# Patient Record
Sex: Female | Born: 1985
Health system: Southern US, Community
[De-identification: ages and names within clinical notes are randomized; demographics above are authoritative.]

## PROBLEM LIST (undated history)

## (undated) DIAGNOSIS — E282 Polycystic ovarian syndrome: Secondary | ICD-10-CM

## (undated) DIAGNOSIS — R87629 Unspecified abnormal cytological findings in specimens from vagina: Secondary | ICD-10-CM

## (undated) DIAGNOSIS — Q141 Congenital malformation of retina: Secondary | ICD-10-CM

## (undated) DIAGNOSIS — F32A Depression, unspecified: Secondary | ICD-10-CM

## (undated) DIAGNOSIS — Z800 Family history of malignant neoplasm of digestive organs: Secondary | ICD-10-CM

## (undated) DIAGNOSIS — Z803 Family history of malignant neoplasm of breast: Secondary | ICD-10-CM

## (undated) DIAGNOSIS — F329 Major depressive disorder, single episode, unspecified: Secondary | ICD-10-CM

## (undated) HISTORY — DX: Unspecified abnormal cytological findings in specimens from vagina: R87.629

## (undated) HISTORY — PX: ANKLE FRACTURE SURGERY: SHX122

## (undated) HISTORY — DX: Congenital malformation of retina: Q14.1

## (undated) HISTORY — DX: Depression, unspecified: F32.A

## (undated) HISTORY — DX: Family history of malignant neoplasm of breast: Z80.3

## (undated) HISTORY — PX: WISDOM TOOTH EXTRACTION: SHX21

## (undated) HISTORY — DX: Family history of malignant neoplasm of digestive organs: Z80.0

---
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
## 2004-04-06 ENCOUNTER — Emergency Department (HOSPITAL_COMMUNITY): Admission: EM | Admit: 2004-04-06 | Discharge: 2004-04-06 | Payer: Self-pay | Admitting: Emergency Medicine

## 2007-01-16 ENCOUNTER — Other Ambulatory Visit: Admission: RE | Admit: 2007-01-16 | Discharge: 2007-01-16 | Payer: Self-pay | Admitting: Obstetrics and Gynecology

## 2007-06-16 ENCOUNTER — Other Ambulatory Visit: Admission: RE | Admit: 2007-06-16 | Discharge: 2007-06-16 | Payer: Self-pay | Admitting: Obstetrics and Gynecology

## 2007-12-08 ENCOUNTER — Other Ambulatory Visit: Admission: RE | Admit: 2007-12-08 | Discharge: 2007-12-08 | Payer: Self-pay | Admitting: Obstetrics and Gynecology

## 2008-06-09 ENCOUNTER — Other Ambulatory Visit: Admission: RE | Admit: 2008-06-09 | Discharge: 2008-06-09 | Payer: Self-pay | Admitting: Obstetrics and Gynecology

## 2008-09-22 ENCOUNTER — Emergency Department (HOSPITAL_COMMUNITY): Admission: EM | Admit: 2008-09-22 | Discharge: 2008-09-22 | Payer: Self-pay | Admitting: Family Medicine

## 2008-12-13 ENCOUNTER — Other Ambulatory Visit: Admission: RE | Admit: 2008-12-13 | Discharge: 2008-12-13 | Payer: Self-pay | Admitting: Obstetrics and Gynecology

## 2009-03-20 ENCOUNTER — Emergency Department (HOSPITAL_COMMUNITY): Admission: EM | Admit: 2009-03-20 | Discharge: 2009-03-20 | Payer: Self-pay | Admitting: Family Medicine

## 2009-06-19 ENCOUNTER — Other Ambulatory Visit: Admission: RE | Admit: 2009-06-19 | Discharge: 2009-06-19 | Payer: Self-pay | Admitting: Obstetrics and Gynecology

## 2009-11-06 ENCOUNTER — Emergency Department (HOSPITAL_COMMUNITY)
Admission: EM | Admit: 2009-11-06 | Discharge: 2009-11-06 | Payer: Self-pay | Source: Home / Self Care | Admitting: Emergency Medicine

## 2010-11-12 ENCOUNTER — Emergency Department (INDEPENDENT_AMBULATORY_CARE_PROVIDER_SITE_OTHER)
Admission: EM | Admit: 2010-11-12 | Discharge: 2010-11-12 | Disposition: A | Discharge status: Home / Self Care | Payer: BC Managed Care – PPO | Source: Home / Self Care | Attending: Emergency Medicine | Admitting: Emergency Medicine

## 2010-11-12 DIAGNOSIS — R059 Cough, unspecified: Secondary | ICD-10-CM

## 2010-11-12 DIAGNOSIS — J069 Acute upper respiratory infection, unspecified: Secondary | ICD-10-CM

## 2010-11-12 DIAGNOSIS — R05 Cough: Secondary | ICD-10-CM

## 2010-11-12 MED ORDER — ACETAMINOPHEN-CODEINE #3 300-30 MG PO TABS: 1.0000 | ORAL_TABLET | Freq: Three times a day (TID) | ORAL | Status: AC

## 2010-11-12 MED ORDER — PREDNISONE 10 MG PO TABS: 20.0000 mg | ORAL_TABLET | Freq: Every day | ORAL | Status: AC

## 2010-11-12 NOTE — ED Notes (Signed)
C/o nasal congestion, sneezing , bad cough for  1 week; most of her secretions are clear, but has some green nasal secretions

## 2010-11-12 NOTE — ED Provider Notes (Signed)
History     CSN: [id number] Arrival date & time: 11/12/2010  3:59 PM   First MD Initiated Contact with Patient 11/12/10 1713      Chief Complaint  Patient presents with  . Nasal Congestion    (Consider location/radiation/quality/duration/timing/severity/associated sxs/prior treatment) Patient is a 25 y.o. female presenting with cough. The history is provided by the patient.  Cough The current episode started more than 1 week ago. The problem has been gradually worsening. The cough is productive of sputum. There has been no fever. Associated symptoms include ear congestion, ear pain, rhinorrhea and shortness of breath. Pertinent negatives include no chest pain, no sweats and no wheezing. She has tried nothing for the symptoms. Risk factors include chemical exposure. She is not a smoker. Her past medical history does not include pneumonia, emphysema or asthma.    History reviewed. No pertinent past medical history.  Past Surgical History  Procedure Date  . Ankle fracture surgery     History reviewed. No pertinent family history.  History  Substance Use Topics  . Smoking status: Never Smoker   . Smokeless tobacco: Not on file  . Alcohol Use:      occasional    OB History    Grav Para Term Preterm Abortions TAB SAB Ect Mult Living                  Review of Systems  Constitutional: Positive for fatigue. Negative for activity change.  HENT: Positive for ear pain, rhinorrhea, postnasal drip and ear discharge.   Respiratory: Positive for cough and shortness of breath. Negative for apnea and wheezing.   Cardiovascular: Negative for chest pain, palpitations and leg swelling.    Allergies  Review of patient's allergies indicates no known allergies.  Home Medications   Current Outpatient Rx  Name Route Sig Dispense Refill  . ACETAMINOPHEN-CODEINE #3 300-30 MG PO TABS Oral Take 1-2 tablets by mouth 3 (three) times daily. 15 tablet 0  . PREDNISONE 10 MG PO TABS Oral Take 2  tablets (20 mg total) by mouth daily. 15 tablet 0    BP 141/92  Pulse 99  Temp(Src) 99.2 F (37.3 C) (Oral)  Resp 20  SpO2 98%  LMP 10/24/2010  Physical Exam  Constitutional: She appears well-developed. No distress.  HENT:  Right Ear: External ear normal.  Left Ear: External ear normal.  Mouth/Throat: No oropharyngeal exudate.  Eyes: Pupils are equal, round, and reactive to light.  Cardiovascular: Normal rate.   Pulmonary/Chest: Effort normal and breath sounds normal. No respiratory distress. She has no wheezes.  Lymphadenopathy:    She has no cervical adenopathy.  Skin: No rash noted. She is not diaphoretic.  Psychiatric: She has a normal mood and affect.    ED Course  Procedures (including critical care time)  Labs Reviewed - No data to display No results found.   1. Cough   2. Upper respiratory infection       MDM  Coughing and congestion        Jimmie Molly 11/12/10 1738

## 2011-08-13 ENCOUNTER — Other Ambulatory Visit: Payer: Self-pay | Admitting: Obstetrics and Gynecology

## 2011-08-13 ENCOUNTER — Other Ambulatory Visit (HOSPITAL_COMMUNITY)
Admission: RE | Admit: 2011-08-13 | Discharge: 2011-08-13 | Disposition: A | Discharge status: Home / Self Care | Payer: BC Managed Care – PPO | Source: Ambulatory Visit | Attending: Obstetrics and Gynecology | Admitting: Obstetrics and Gynecology

## 2011-08-13 DIAGNOSIS — Z113 Encounter for screening for infections with a predominantly sexual mode of transmission: Secondary | ICD-10-CM | POA: Insufficient documentation

## 2011-08-13 DIAGNOSIS — N760 Acute vaginitis: Secondary | ICD-10-CM | POA: Insufficient documentation

## 2011-08-13 DIAGNOSIS — Z01419 Encounter for gynecological examination (general) (routine) without abnormal findings: Secondary | ICD-10-CM | POA: Insufficient documentation

## 2012-10-30 ENCOUNTER — Other Ambulatory Visit: Payer: Self-pay | Admitting: Obstetrics and Gynecology

## 2012-10-30 ENCOUNTER — Other Ambulatory Visit (HOSPITAL_COMMUNITY)
Admission: RE | Admit: 2012-10-30 | Discharge: 2012-10-30 | Disposition: A | Discharge status: Home / Self Care | Payer: BC Managed Care – PPO | Source: Ambulatory Visit | Attending: Obstetrics and Gynecology | Admitting: Obstetrics and Gynecology

## 2012-10-30 DIAGNOSIS — Z01419 Encounter for gynecological examination (general) (routine) without abnormal findings: Secondary | ICD-10-CM | POA: Insufficient documentation

## 2013-11-08 ENCOUNTER — Other Ambulatory Visit: Payer: Self-pay | Admitting: Obstetrics and Gynecology

## 2013-11-08 ENCOUNTER — Other Ambulatory Visit (HOSPITAL_COMMUNITY)
Admission: RE | Admit: 2013-11-08 | Discharge: 2013-11-08 | Disposition: A | Discharge status: Home / Self Care | Payer: BC Managed Care – PPO | Source: Ambulatory Visit | Attending: Obstetrics and Gynecology | Admitting: Obstetrics and Gynecology

## 2013-11-08 DIAGNOSIS — Z1151 Encounter for screening for human papillomavirus (HPV): Secondary | ICD-10-CM | POA: Insufficient documentation

## 2013-11-08 DIAGNOSIS — Z01419 Encounter for gynecological examination (general) (routine) without abnormal findings: Secondary | ICD-10-CM | POA: Diagnosis not present

## 2013-11-11 LAB — CYTOLOGY - PAP

## 2014-12-06 ENCOUNTER — Other Ambulatory Visit: Payer: Self-pay | Admitting: Obstetrics and Gynecology

## 2014-12-06 ENCOUNTER — Other Ambulatory Visit (HOSPITAL_COMMUNITY)
Admission: RE | Admit: 2014-12-06 | Discharge: 2014-12-06 | Disposition: A | Discharge status: Home / Self Care | Payer: BLUE CROSS/BLUE SHIELD | Source: Ambulatory Visit | Attending: Obstetrics and Gynecology | Admitting: Obstetrics and Gynecology

## 2014-12-06 DIAGNOSIS — Z01419 Encounter for gynecological examination (general) (routine) without abnormal findings: Secondary | ICD-10-CM | POA: Diagnosis present

## 2014-12-08 LAB — CYTOLOGY - PAP

## 2015-10-12 ENCOUNTER — Emergency Department (HOSPITAL_COMMUNITY): Payer: BLUE CROSS/BLUE SHIELD

## 2015-10-12 ENCOUNTER — Emergency Department (HOSPITAL_COMMUNITY)
Admission: EM | Admit: 2015-10-12 | Discharge: 2015-10-12 | Disposition: A | Discharge status: Home / Self Care | Payer: BLUE CROSS/BLUE SHIELD | Attending: Emergency Medicine | Admitting: Emergency Medicine

## 2015-10-12 ENCOUNTER — Encounter (HOSPITAL_COMMUNITY): Payer: Self-pay | Admitting: *Deleted

## 2015-10-12 DIAGNOSIS — R1013 Epigastric pain: Secondary | ICD-10-CM | POA: Insufficient documentation

## 2015-10-12 DIAGNOSIS — R12 Heartburn: Secondary | ICD-10-CM

## 2015-10-12 DIAGNOSIS — K8020 Calculus of gallbladder without cholecystitis without obstruction: Secondary | ICD-10-CM | POA: Insufficient documentation

## 2015-10-12 LAB — URINALYSIS, ROUTINE W REFLEX MICROSCOPIC
Bilirubin Urine: NEGATIVE
Glucose, UA: NEGATIVE mg/dL
HGB URINE DIPSTICK: NEGATIVE
Ketones, ur: NEGATIVE mg/dL
Nitrite: POSITIVE — AB
PROTEIN: NEGATIVE mg/dL
Specific Gravity, Urine: 1.028 (ref 1.005–1.030)
pH: 6 (ref 5.0–8.0)

## 2015-10-12 LAB — COMPREHENSIVE METABOLIC PANEL
ALT: 58 U/L — ABNORMAL HIGH (ref 14–54)
ANION GAP: 10 (ref 5–15)
AST: 111 U/L — ABNORMAL HIGH (ref 15–41)
Albumin: 3.7 g/dL (ref 3.5–5.0)
Alkaline Phosphatase: 89 U/L (ref 38–126)
BILIRUBIN TOTAL: 0.4 mg/dL (ref 0.3–1.2)
BUN: 14 mg/dL (ref 6–20)
CALCIUM: 9.5 mg/dL (ref 8.9–10.3)
CO2: 25 mmol/L (ref 22–32)
CREATININE: 0.87 mg/dL (ref 0.44–1.00)
Chloride: 104 mmol/L (ref 101–111)
Glucose, Bld: 113 mg/dL — ABNORMAL HIGH (ref 65–99)
POTASSIUM: 3.7 mmol/L (ref 3.5–5.1)
Sodium: 139 mmol/L (ref 135–145)
Total Protein: 7.2 g/dL (ref 6.5–8.1)

## 2015-10-12 LAB — CBC
HCT: 39.7 % (ref 36.0–46.0)
Hemoglobin: 12.3 g/dL (ref 12.0–15.0)
MCH: 22.1 pg — AB (ref 26.0–34.0)
MCHC: 31 g/dL (ref 30.0–36.0)
MCV: 71.3 fL — ABNORMAL LOW (ref 78.0–100.0)
PLATELETS: 261 10*3/uL (ref 150–400)
RBC: 5.57 MIL/uL — AB (ref 3.87–5.11)
RDW: 14.9 % (ref 11.5–15.5)
WBC: 11.3 10*3/uL — AB (ref 4.0–10.5)

## 2015-10-12 LAB — URINE MICROSCOPIC-ADD ON: RBC / HPF: NONE SEEN RBC/hpf (ref 0–5)

## 2015-10-12 LAB — LIPASE, BLOOD: LIPASE: 32 U/L (ref 11–51)

## 2015-10-12 MED ORDER — GI COCKTAIL ~~LOC~~
30.0000 mL | Freq: Once | ORAL | Status: AC
Start: 2015-10-12 — End: 2015-10-12
  Administered 2015-10-12: 30 mL via ORAL
  Filled 2015-10-12: qty 30

## 2015-10-12 MED ORDER — HYDROCODONE-ACETAMINOPHEN 5-325 MG PO TABS: 1.0000 | ORAL_TABLET | ORAL | 0 refills | Status: DC | PRN

## 2015-10-12 NOTE — ED Notes (Signed)
Pt taken to US

## 2015-10-12 NOTE — ED Provider Notes (Signed)
[facility]-EMERGENCY DEPT Provider Note   CSN: [id number] Arrival date & time: 10/12/15  0203  By signing my name below, I, Emmanuella Mensah, attest that this documentation has been prepared under the direction and in the presence of Mancel Bale, MD. Electronically Signed: Angelene Giovanni, ED Scribe. 10/12/15. 3:54 AM.   History   Chief Complaint Chief Complaint  Patient presents with  . Abdominal Pain   HPI Comments: Kathy Mason is a 30 y.o. female who presents to the Emergency Department complaining of gradually improving moderate epigastric pain with a sensation that something is "lodged there" onset yesterday. She states that she had a salad for dinner yesterday. No alleviating factors noted. Pt has not tried any medications PTA but states that she tried to increase her fluid intake with no relief. She reports that she had a similar episode of these symptoms last week that only lasted for 15-20 minutes that resolved on its own. Pt has NKDA. She denies any fever, chest pain, shortness of breath, nausea, or vomiting.   The history is provided by the patient. No language interpreter was used.  Abdominal Pain   This is a new problem. The current episode started yesterday. The problem has been gradually improving. The pain is associated with eating. The pain is located in the epigastric region. The pain is moderate. Pertinent negatives include fever, nausea and vomiting. Nothing aggravates the symptoms. Nothing relieves the symptoms.    History reviewed. No pertinent past medical history.  There are no active problems to display for this patient.   Past Surgical History:  Procedure Laterality Date  . ANKLE FRACTURE SURGERY      OB History    No data available       Home Medications    Prior to Admission medications   Medication Sig Start Date End Date Taking? Authorizing Provider  HYDROcodone-acetaminophen (NORCO) 5-325 MG tablet Take 1 tablet by mouth every 4 (four) hours  as needed. 10/12/15   Mancel Bale, MD    Family History History reviewed. No pertinent family history.  Social History Social History  Substance Use Topics  . Smoking status: Never Smoker  . Smokeless tobacco: Not on file  . Alcohol use Not on file     Comment: occasional     Allergies   Review of patient's allergies indicates no known allergies.   Review of Systems Review of Systems  Constitutional: Negative for fever.  Respiratory: Negative for shortness of breath.   Cardiovascular: Negative for chest pain.  Gastrointestinal: Positive for abdominal pain. Negative for nausea and vomiting.  All other systems reviewed and are negative.    Physical Exam Updated Vital Signs BP 124/72   Pulse 66   Temp 97.8 F (36.6 C) (Oral)   Resp 16   LMP 09/24/2015   SpO2 100%   Physical Exam  Constitutional: She is oriented to person, place, and time. She appears well-developed and well-nourished. No distress.  HENT:  Head: Normocephalic and atraumatic.  Eyes: Conjunctivae and EOM are normal.  Neck: Neck supple. No tracheal deviation present.  Cardiovascular: Normal rate.   Pulmonary/Chest: Effort normal. No respiratory distress.  Abdominal: There is tenderness.  Mild epigastric tenderness  Musculoskeletal: Normal range of motion.  Neurological: She is alert and oriented to person, place, and time.  Skin: Skin is warm and dry.  Psychiatric: She has a normal mood and affect. Her behavior is normal.  Nursing note and vitals reviewed.  ED Treatments / Results  DIAGNOSTIC STUDIES: Oxygen Saturation  is 97% on RA, adequate by my interpretation.    COORDINATION OF CARE: 3:51 AM- Pt advised of plan for treatment and pt agrees. Pt will receive lab work for further evaluation.    Labs (all labs ordered are listed, but only abnormal results are displayed) Labs Reviewed  COMPREHENSIVE METABOLIC PANEL - Abnormal; Notable for the following:       Result Value   Glucose, Bld 113  (*)    AST 111 (*)    ALT 58 (*)    All other components within normal limits  CBC - Abnormal; Notable for the following:    WBC 11.3 (*)    RBC 5.57 (*)    MCV 71.3 (*)    MCH 22.1 (*)    All other components within normal limits  URINALYSIS, ROUTINE W REFLEX MICROSCOPIC (NOT AT [facility]) - Abnormal; Notable for the following:    Nitrite POSITIVE (*)    Leukocytes, UA TRACE (*)    All other components within normal limits  URINE MICROSCOPIC-ADD ON - Abnormal; Notable for the following:    Squamous Epithelial / LPF 6-30 (*)    Bacteria, UA MANY (*)    All other components within normal limits  URINE CULTURE  LIPASE, BLOOD    EKG  EKG Interpretation None       Radiology Kore[place] Abdomen Complete  Result Date: 10/12/2015 CLINICAL DATA:  Epigastric pain and elevated liver function tests. Pain for 2 weeks but worse today. White cell count 11.3. EXAM: ABDOMEN ULTRASOUND COMPLETE COMPARISON:  None. FINDINGS: Gallbladder: Cholelithiasis with multiple stones and sludge in the gallbladder. Gallbladder is contracted. No gallbladder wall thickening. Murphy's sign is negative. Common bile duct: Diameter: 4.1 mm, normal Liver: Increased liver parenchymal echotexture suggesting fatty infiltration. No focal lesions identified. IVC: No abnormality visualized. Pancreas: Not well visualized due to overlying bowel gas. Spleen: Size and appearance within normal limits. Right Kidney: Length: 10.6 cm. Echogenicity within normal limits. No mass or hydronephrosis visualized. Left Kidney: Length: 10.3 cm. Echogenicity within normal limits. No mass or hydronephrosis visualized. Abdominal aorta: No aneurysm visualized. Other findings: None. IMPRESSION: Cholelithiasis with stones and sludge in the gallbladder. No other changes to suggest cholecystitis. Fatty infiltration of the liver. Electronically Signed   By: Burman NievesWilliam  Stevens M.D.   On: 10/12/2015 06:34    Procedures Procedures (including critical care  time)  Medications Ordered in ED Medications  gi cocktail (Maalox,Lidocaine,Donnatal) (30 mLs Oral Given 10/12/15 0356)     Initial Impression / Assessment and Plan / ED Course  Mancel BaleElliott Addelynn Batte, MD has reviewed the triage vital signs and the nursing notes.  Pertinent labs & imaging results that were available during my care of the patient were reviewed by me and considered in my medical decision making (see chart for details).  Clinical Course    Medications  gi cocktail (Maalox,Lidocaine,Donnatal) (30 mLs Oral Given 10/12/15 0356)    Patient Vitals for the past 24 hrs:  BP Temp Temp src Pulse Resp SpO2  10/12/15 0700 124/72 - - 66 16 100 %  10/12/15 0637 125/81 - - 64 16 99 %  10/12/15 0209 127/80 97.8 F (36.6 C) Oral 91 18 97 %    4:38 AM Reevaluation with update and discussion. After initial assessment and treatment, an updated evaluation reveals epigastric pain better after GI cocktail. She would like further testing, U/S ordered.Mancel Bale. Madellyn Denio L    Final Clinical Impressions(s) / ED Diagnoses   Final diagnoses:  Epigastric pain  Gall stones  Heartburn   Epigastric abdominal pain, with gallstones. Clinical evaluation is consistent with heartburn as source of pain. Doubt cholecystitis, serious bacterial infection or impending vascular collapse.  Nursing Notes Reviewed/ Care Coordinated Applicable Imaging Reviewed Interpretation of Laboratory Data incorporated into ED treatment  The patient appears reasonably screened and/or stabilized for discharge and I doubt any other medical condition or other [facility] requiring further screening, evaluation, or treatment in the ED at this time prior to discharge.  Plan: Home Medications- Antacid AC/HS; Home Treatments- low fat diet; return here if the recommended treatment, does not improve the symptoms; Recommended follow up- General Surgery 1-2 weeks   New Prescriptions Discharge Medication List as of 10/12/2015  7:11 AM    START  taking these medications   Details  HYDROcodone-acetaminophen (NORCO) 5-325 MG tablet Take 1 tablet by mouth every 4 (four) hours as needed., Starting Thu 10/12/2015, Print       I personally performed the services described in this documentation, which was scribed in my presence. The recorded information has been reviewed and is accurate.     Mancel Bale, MD 10/12/15 [phone]

## 2015-10-12 NOTE — Discharge Instructions (Signed)
Exact cause severe abdominal pain, is not clear.  Take an antacid such as Mylanta, before meals and at bedtime, to improve the discomfort.  Avoid all fatty foods, in case your gallbladder is causing the problem.  Call the surgery office for a follow-up appointment to be seen about the gallstones, found on CT imaging today.  Do not drive, if you are taking the narcotic pain reliever.

## 2015-10-12 NOTE — ED Triage Notes (Signed)
Pt states she woke up with mid abdominal pain. Pt states it "feels like a piece of chicken is lodged". Pt denies n/v/d.

## 2015-10-15 LAB — URINE CULTURE

## 2015-10-17 ENCOUNTER — Telehealth (HOSPITAL_BASED_OUTPATIENT_CLINIC_OR_DEPARTMENT_OTHER): Payer: Self-pay | Admitting: Emergency Medicine

## 2015-10-17 NOTE — Telephone Encounter (Signed)
Post ED Visit - Positive Culture Follow-up: Successful Patient Follow-Up  Culture assessed and recommendations reviewed by: []  Enzo BiNathan Batchelder, Pharm.D. []  Celedonio MiyamotoJeremy Frens, Pharm.D., BCPS [x]  Garvin FilaMike Maccia, Pharm.D. []  Georgina PillionElizabeth Martin, Pharm.D., BCPS []  Pine HillMinh Pham, [place]Pharm.D., BCPS, AAHIVP []  Estella HuskMichelle Turner, Pharm.D., BCPS, AAHIVP []  Tennis Mustassie Stewart, Pharm.D. []  Rob Oswaldo DoneVincent, [address]Pharm.D.  Positive urine culture  [x]  Patient discharged without antimicrobial prescription and treatment is now indicated []  Organism is resistant to prescribed ED discharge antimicrobial []  Patient with positive blood cultures  Changes discussed with ED provider: Arthor CaptainAbigail Harris PA New antibiotic prescription symptom check, if abdominal pain, start Keflex 500mg  po bid x 1 week  Attempting to contact patient   Berle MullMiller, Jayden Rudge 10/17/2015, 12:27 PM

## 2015-10-17 NOTE — Progress Notes (Signed)
ED Antimicrobial Stewardship Positive Culture Follow Up   Jim Desanctisrika Dunkerson is an 30 y.o. female [facility] with a chief complaint of  Chief Complaint  Patient presents with  . Abdominal Pain    Recent Results (from the past 720 hour(s))  Urine culture     Status: Abnormal   Collection Time: 10/12/15  2:17 AM  Result Value Ref Range Status   Specimen Description URINE, CLEAN CATCH  Final   Special Requests NONE  Final   Culture >=100,000 COLONIES/mL ESCHERICHIA COLI (A)  Final   Report Status 10/15/2015 FINAL  Final   Organism ID, Bacteria ESCHERICHIA COLI (A)  Final      Susceptibility   Escherichia coli - MIC*    AMPICILLIN >=32 RESISTANT Resistant     CEFAZOLIN 8 SENSITIVE Sensitive     CEFTRIAXONE <=1 SENSITIVE Sensitive     CIPROFLOXACIN <=0.25 SENSITIVE Sensitive     GENTAMICIN <=1 SENSITIVE Sensitive     IMIPENEM <=0.25 SENSITIVE Sensitive     NITROFURANTOIN 64 INTERMEDIATE Intermediate     TRIMETH/SULFA >=320 RESISTANT Resistant     AMPICILLIN/SULBACTAM 16 INTERMEDIATE Intermediate     Extended ESBL NEGATIVE Sensitive     * >=100,000 COLONIES/mL ESCHERICHIA COLI    If patient still with abd pain symptoms cephalexin 500 mg bid x 1 week If symptoms resolved, no abx  ED Provider: Arthor CaptainAbigail Harris, Langston MaskerPa-C  Carlin Mamone A Toniesha Zellner 10/17/2015, 11:09 AM Infectious Diseases Pharmacist Phone# [phone]

## 2015-10-27 ENCOUNTER — Telehealth (HOSPITAL_BASED_OUTPATIENT_CLINIC_OR_DEPARTMENT_OTHER): Payer: Self-pay | Admitting: *Deleted

## 2015-10-27 NOTE — Telephone Encounter (Signed)
Post ED Visit - Positive Culture Follow-up  Culture report reviewed by antimicrobial stewardship pharmacist:  []  Enzo BiNathan Batchelder, Pharm.D. []  Celedonio MiyamotoJeremy Frens, [address] BoulevardPharm.D., BCPS []  Garvin FilaMike Maccia, Pharm.D. []  Georgina PillionElizabeth Martin, Pharm.D., BCPS []  JohnsonMinh Pham, [address]Pharm.D., BCPS, AAHIVP []  Estella HuskMichelle Turner, Pharm.D., BCPS, AAHIVP []  Tennis Mustassie Stewart, Pharm.D. []  Rob Oswaldo DoneVincent, [address] BoulevardPharm[address]D. Arthor CaptainAbigail Harris, PA-C  Positive urine culture  Patient she is currently asymptomatic and has scheduled F/U with PCP related to her gallstones.  Virl AxeRobertson, Mitsuye Schrodt [facility] 10/27/2015, 11:54 AM

## 2016-01-12 ENCOUNTER — Encounter (HOSPITAL_COMMUNITY): Payer: Self-pay | Admitting: *Deleted

## 2016-01-12 NOTE — Progress Notes (Signed)
Please place Surgical Orders in epic   thanks

## 2016-01-17 ENCOUNTER — Ambulatory Visit: Payer: Self-pay | Admitting: Surgery

## 2016-01-17 NOTE — H&P (Signed)
Kathy Mason 01/12/2016 10:38 AM Location: [facility] Patient #: [id number] DOB: 09/23/1985 Single / Language: [place] / Race: Black or African American Female   History of Present Illness Molli Hazard(Javonda Suh B. Daphine DeutscherMartin MD; 01/12/2016 11:27 AM) The patient is a 31 year old female who presents for evaluation of gall stones. The onset of the gall stones has been acute and has been occurring in a recurrent pattern for 3 hours. She had an attack last week and her first attack was last October. The recent attack was after pepperoni pizza.  Her mother had attacks with her gallbladder. She [facility] and this showed sludge and stones back in October.  I discussed laparoscopic cholecystectomy with her in detail and including complications not limited to common bile duct injury or bile leaks. She is aware of the nature the operation, did give her a booklet on this again emphasizing the risk and benefits. 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 We may try to set her up for this next week or we may have to wait until she is off for a school break.  Her only prior surgery was reconstruction of right ankle injury which she had 4 surgeries on that. She's never had any abdominal surgery.    Past Surgical History Christianne Dolin(Christen Lambert, [place]MA; 01/12/2016 10:38 AM) Foot Surgery  Right. Oral Surgery   Diagnostic Studies History Christianne Dolin(Christen Lambert, [place]MA; 01/12/2016 10:38 AM) Colonoscopy  never Mammogram  never  Allergies Christianne Dolin(Christen Lambert, RMA; 01/12/2016 10:39 AM) No Known Drug Allergies 01/12/2016  Medication History Christianne Dolin(Christen Lambert, RMA; 01/12/2016 10:40 AM) Hydrocodone-Acetaminophen (5-325MG  Tablet, Oral daily) Active. Contrave (8-90MG  Tablet ER 12HR, Oral) Active. (havn't started) Medications Reconciled  Social History Christianne Dolin(Christen Lambert, [place]; 01/12/2016 10:38 AM) Alcohol use  Occasional alcohol use. Caffeine use  Carbonated  beverages, Tea. No drug use  Tobacco use  Never smoker.  Family History Christianne Dolin(Christen Lambert, [place]MA; 01/12/2016 10:38 AM) Bleeding disorder  Mother. Breast Cancer  Family Members In General. Colon Polyps  Family Members In General, Father. Depression  Father, Mother. Diabetes Mellitus  Brother, Family Members In General, Father, Mother. Heart Disease  Family Members In General. Hypertension  Brother, Family Members In General, Father, Mother. Kidney Disease  Family Members In General, Mother. Migraine Headache  Brother. Seizure disorder  Brother.  Pregnancy / Birth History Christianne Dolin(Christen Lambert, [place]; 01/12/2016 10:38 AM) Age at menarche  10 years. Contraceptive History  Oral contraceptives. Gravida  0 Irregular periods  Para  0  Other Problems Christianne Dolin(Christen Lambert, RMA; 01/12/2016 10:38 AM) No pertinent past medical history     Review of Systems Christianne Dolin(Christen Lambert RMA; 01/12/2016 10:38 AM) General Not Present- Appetite Loss, Chills, Fatigue, Fever, Night Sweats, Weight Gain and Weight Loss. Skin Not Present- Change in Wart/Mole, Dryness, Hives, Jaundice, New Lesions, Non-Healing Wounds, Rash and Ulcer. HEENT Present- Seasonal Allergies and Sinus Pain. Not Present- Earache, Hearing Loss, Hoarseness, Nose Bleed, Oral Ulcers, Ringing in the Ears, Sore Throat, Visual Disturbances, Wears glasses/contact lenses and Yellow Eyes. Respiratory Present- Snoring. Not Present- Bloody sputum, Chronic Cough, Difficulty Breathing and Wheezing. Breast Not Present- Breast Mass, Breast Pain, Nipple Discharge and Skin Changes. Cardiovascular Not Present- Chest Pain, Difficulty Breathing Lying Down, Leg Cramps, Palpitations, Rapid Heart Rate, Shortness of Breath and Swelling of Extremities. Gastrointestinal Present- Abdominal Pain, Change in Bowel Habits, Indigestion and Nausea. Not Present- Bloating, Bloody Stool, Chronic diarrhea, Constipation, Difficulty Swallowing, Excessive gas, Gets full quickly  at  meals, Hemorrhoids, Rectal Pain and Vomiting. Female Genitourinary Not Present- Frequency, Nocturia, Painful Urination, Pelvic Pain and Urgency. Musculoskeletal Not Present- Back Pain, Joint Pain, Joint Stiffness, Muscle Pain, Muscle Weakness and Swelling of Extremities. Neurological Present- Headaches. Not Present- Decreased Memory, Fainting, Numbness, Seizures, Tingling, Tremor, Trouble walking and Weakness. Psychiatric Present- Anxiety. Not Present- Bipolar, Change in Sleep Pattern, Depression, Fearful and Frequent crying. Endocrine Not Present- Cold Intolerance, Excessive Hunger, Hair Changes, Heat Intolerance, Hot flashes and New Diabetes. Hematology Not Present- Blood Thinners, Easy Bruising, Excessive bleeding, Gland problems, HIV and Persistent Infections.  Vitals Christianne Dolin RMA; 01/12/2016 10:41 AM) 01/12/2016 10:40 AM Weight: 290.2 lb Height: 63in Body Surface Area: 2.27 m Body Mass Index: 51.41 kg/m  Temp.: 97.22F  Pulse: 84 (Regular)  BP: 112/82 (Sitting, Left Arm, Standard)       Physical Exam (Jesslyn Viglione B. Daphine Deutscher MD; 01/12/2016 11:27 AM) General Note: overweight AAF NAD HEENT no icterus Neck supple Chest clear Heart SR Abdomen obese and nontender Ext prior right ankle surgery.     Assessment & Plan Molli Hazard B. Daphine Deutscher MD; 01/12/2016 11:32 AM) CHRONIC CHOLECYSTITIS (K81.1) Impression: Will try to schedule lap chole IOC to accomodate her graduate studies commitments.

## 2016-01-17 NOTE — Progress Notes (Signed)
[facility] Nurse stated Dr. Daphine DeutscherMartin will put them in today.

## 2016-01-18 ENCOUNTER — Encounter (HOSPITAL_COMMUNITY): Payer: Self-pay | Admitting: *Deleted

## 2016-01-18 ENCOUNTER — Ambulatory Visit (HOSPITAL_COMMUNITY)
Admission: RE | Admit: 2016-01-18 | Discharge: 2016-01-18 | Disposition: A | Discharge status: Home / Self Care | Payer: BLUE CROSS/BLUE SHIELD | Source: Ambulatory Visit | Attending: Surgery | Admitting: Surgery

## 2016-01-18 ENCOUNTER — Encounter (HOSPITAL_COMMUNITY)
Admission: RE | Disposition: A | Discharge status: Home / Self Care | Payer: Self-pay | Source: Ambulatory Visit | Attending: Surgery

## 2016-01-18 ENCOUNTER — Ambulatory Visit (HOSPITAL_COMMUNITY): Payer: BLUE CROSS/BLUE SHIELD

## 2016-01-18 ENCOUNTER — Ambulatory Visit (HOSPITAL_COMMUNITY): Payer: BLUE CROSS/BLUE SHIELD | Admitting: Anesthesiology

## 2016-01-18 DIAGNOSIS — Z6841 Body Mass Index (BMI) 40.0 and over, adult: Secondary | ICD-10-CM | POA: Diagnosis not present

## 2016-01-18 DIAGNOSIS — Z8379 Family history of other diseases of the digestive system: Secondary | ICD-10-CM | POA: Diagnosis not present

## 2016-01-18 DIAGNOSIS — Z818 Family history of other mental and behavioral disorders: Secondary | ICD-10-CM | POA: Diagnosis not present

## 2016-01-18 DIAGNOSIS — Z8371 Family history of colonic polyps: Secondary | ICD-10-CM | POA: Insufficient documentation

## 2016-01-18 DIAGNOSIS — Z79899 Other long term (current) drug therapy: Secondary | ICD-10-CM | POA: Diagnosis not present

## 2016-01-18 DIAGNOSIS — Z833 Family history of diabetes mellitus: Secondary | ICD-10-CM | POA: Diagnosis not present

## 2016-01-18 DIAGNOSIS — Z820 Family history of epilepsy and other diseases of the nervous system: Secondary | ICD-10-CM | POA: Diagnosis not present

## 2016-01-18 DIAGNOSIS — K811 Chronic cholecystitis: Secondary | ICD-10-CM | POA: Insufficient documentation

## 2016-01-18 DIAGNOSIS — Z8249 Family history of ischemic heart disease and other diseases of the circulatory system: Secondary | ICD-10-CM | POA: Diagnosis not present

## 2016-01-18 DIAGNOSIS — K828 Other specified diseases of gallbladder: Secondary | ICD-10-CM | POA: Diagnosis not present

## 2016-01-18 DIAGNOSIS — Z803 Family history of malignant neoplasm of breast: Secondary | ICD-10-CM | POA: Diagnosis not present

## 2016-01-18 DIAGNOSIS — K8010 Calculus of gallbladder with chronic cholecystitis without obstruction: Secondary | ICD-10-CM | POA: Diagnosis present

## 2016-01-18 DIAGNOSIS — Z419 Encounter for procedure for purposes other than remedying health state, unspecified: Secondary | ICD-10-CM

## 2016-01-18 DIAGNOSIS — Z9049 Acquired absence of other specified parts of digestive tract: Secondary | ICD-10-CM

## 2016-01-18 HISTORY — DX: Polycystic ovarian syndrome: E28.2

## 2016-01-18 HISTORY — PX: CHOLECYSTECTOMY: SHX55

## 2016-01-18 LAB — HCG, SERUM, QUALITATIVE: PREG SERUM: NEGATIVE

## 2016-01-18 SURGERY — LAPAROSCOPIC CHOLECYSTECTOMY WITH INTRAOPERATIVE CHOLANGIOGRAM
Anesthesia: General

## 2016-01-18 MED ORDER — FENTANYL CITRATE (PF) 250 MCG/5ML IJ SOLN
INTRAMUSCULAR | Status: AC
  Filled 2016-01-18: qty 5

## 2016-01-18 MED ORDER — FENTANYL CITRATE (PF) 100 MCG/2ML IJ SOLN
INTRAMUSCULAR | Status: AC
  Filled 2016-01-18: qty 2

## 2016-01-18 MED ORDER — CEFAZOLIN SODIUM-DEXTROSE 2-4 GM/100ML-% IV SOLN: 2.0000 g | INTRAVENOUS | Status: DC

## 2016-01-18 MED ORDER — HYDROMORPHONE HCL 1 MG/ML IJ SOLN
0.2500 mg | INTRAMUSCULAR | Status: DC | PRN
  Administered 2016-01-18 (×2): 0.5 mg via INTRAVENOUS

## 2016-01-18 MED ORDER — HYDROMORPHONE HCL 1 MG/ML IJ SOLN
INTRAMUSCULAR | Status: AC
  Filled 2016-01-18: qty 1

## 2016-01-18 MED ORDER — DEXTROSE 5 % IV SOLN
3.0000 g | Freq: Once | INTRAVENOUS | Status: AC
  Administered 2016-01-18: 3 g via INTRAVENOUS
  Filled 2016-01-18: qty 3

## 2016-01-18 MED ORDER — MIDAZOLAM HCL 2 MG/2ML IJ SOLN
INTRAMUSCULAR | Status: DC | PRN
  Administered 2016-01-18: 2 mg via INTRAVENOUS

## 2016-01-18 MED ORDER — IOPAMIDOL (ISOVUE-300) INJECTION 61%
INTRAVENOUS | Status: AC
  Filled 2016-01-18: qty 50

## 2016-01-18 MED ORDER — SUCCINYLCHOLINE CHLORIDE 200 MG/10ML IV SOSY
PREFILLED_SYRINGE | INTRAVENOUS | Status: DC | PRN
  Administered 2016-01-18: 160 mg via INTRAVENOUS

## 2016-01-18 MED ORDER — 0.9 % SODIUM CHLORIDE (POUR BTL) OPTIME
TOPICAL | Status: DC | PRN
  Administered 2016-01-18: 1000 mL

## 2016-01-18 MED ORDER — LACTATED RINGERS IR SOLN
Status: DC | PRN
  Administered 2016-01-18: 1000 mL

## 2016-01-18 MED ORDER — ONDANSETRON HCL 4 MG/2ML IJ SOLN: 4.0000 mg | Freq: Four times a day (QID) | INTRAMUSCULAR | Status: DC | PRN

## 2016-01-18 MED ORDER — BUPIVACAINE HCL (PF) 0.25 % IJ SOLN
INTRAMUSCULAR | Status: DC | PRN
  Administered 2016-01-18: 30 mL

## 2016-01-18 MED ORDER — DEXAMETHASONE SODIUM PHOSPHATE 10 MG/ML IJ SOLN
INTRAMUSCULAR | Status: AC
  Filled 2016-01-18: qty 1

## 2016-01-18 MED ORDER — LABETALOL HCL 5 MG/ML IV SOLN
INTRAVENOUS | Status: DC | PRN
  Administered 2016-01-18: 5 mg via INTRAVENOUS

## 2016-01-18 MED ORDER — PROPOFOL 10 MG/ML IV BOLUS
INTRAVENOUS | Status: AC
  Filled 2016-01-18: qty 20

## 2016-01-18 MED ORDER — OXYCODONE HCL 5 MG PO TABS: 5.0000 mg | ORAL_TABLET | Freq: Once | ORAL | Status: DC | PRN

## 2016-01-18 MED ORDER — ROCURONIUM BROMIDE 50 MG/5ML IV SOSY
PREFILLED_SYRINGE | INTRAVENOUS | Status: AC
  Filled 2016-01-18: qty 5

## 2016-01-18 MED ORDER — OXYCODONE HCL 5 MG/5ML PO SOLN
5.0000 mg | Freq: Once | ORAL | Status: DC | PRN
  Filled 2016-01-18: qty 5

## 2016-01-18 MED ORDER — LIDOCAINE 2% (20 MG/ML) 5 ML SYRINGE
INTRAMUSCULAR | Status: AC
  Filled 2016-01-18: qty 5

## 2016-01-18 MED ORDER — SUGAMMADEX SODIUM 200 MG/2ML IV SOLN
INTRAVENOUS | Status: DC | PRN
  Administered 2016-01-18: 300 mg via INTRAVENOUS

## 2016-01-18 MED ORDER — LABETALOL HCL 5 MG/ML IV SOLN
INTRAVENOUS | Status: AC
  Filled 2016-01-18: qty 4

## 2016-01-18 MED ORDER — CHLORHEXIDINE GLUCONATE CLOTH 2 % EX PADS: 6.0000 | MEDICATED_PAD | Freq: Once | CUTANEOUS | Status: DC

## 2016-01-18 MED ORDER — FENTANYL CITRATE (PF) 100 MCG/2ML IJ SOLN
INTRAMUSCULAR | Status: DC | PRN
  Administered 2016-01-18: 50 ug via INTRAVENOUS
  Administered 2016-01-18: 150 ug via INTRAVENOUS
  Administered 2016-01-18: 50 ug via INTRAVENOUS
  Administered 2016-01-18: 100 ug via INTRAVENOUS

## 2016-01-18 MED ORDER — ROCURONIUM BROMIDE 10 MG/ML (PF) SYRINGE
PREFILLED_SYRINGE | INTRAVENOUS | Status: DC | PRN
  Administered 2016-01-18: 40 mg via INTRAVENOUS
  Administered 2016-01-18: 10 mg via INTRAVENOUS

## 2016-01-18 MED ORDER — SUCCINYLCHOLINE CHLORIDE 200 MG/10ML IV SOSY
PREFILLED_SYRINGE | INTRAVENOUS | Status: AC
  Filled 2016-01-18: qty 10

## 2016-01-18 MED ORDER — ONDANSETRON HCL 4 MG/2ML IJ SOLN
INTRAMUSCULAR | Status: AC
  Filled 2016-01-18: qty 2

## 2016-01-18 MED ORDER — HEPARIN SODIUM (PORCINE) 5000 UNIT/ML IJ SOLN
5000.0000 [IU] | Freq: Once | INTRAMUSCULAR | Status: AC
  Administered 2016-01-18: 5000 [IU] via SUBCUTANEOUS
  Filled 2016-01-18: qty 1

## 2016-01-18 MED ORDER — OXYCODONE HCL 5 MG PO TABS: 5.0000 mg | ORAL_TABLET | Freq: Once | ORAL | 0 refills | Status: DC | PRN

## 2016-01-18 MED ORDER — SUGAMMADEX SODIUM 500 MG/5ML IV SOLN
INTRAVENOUS | Status: AC
  Filled 2016-01-18: qty 5

## 2016-01-18 MED ORDER — ONDANSETRON HCL 4 MG/2ML IJ SOLN
INTRAMUSCULAR | Status: DC | PRN
  Administered 2016-01-18: 4 mg via INTRAVENOUS

## 2016-01-18 MED ORDER — BUPIVACAINE HCL (PF) 0.25 % IJ SOLN
INTRAMUSCULAR | Status: AC
  Filled 2016-01-18: qty 30

## 2016-01-18 MED ORDER — IOPAMIDOL (ISOVUE-300) INJECTION 61%
INTRAVENOUS | Status: DC | PRN
  Administered 2016-01-18: 4 mL

## 2016-01-18 MED ORDER — PROPOFOL 10 MG/ML IV BOLUS
INTRAVENOUS | Status: DC | PRN
  Administered 2016-01-18: 200 mg via INTRAVENOUS

## 2016-01-18 MED ORDER — BUPIVACAINE LIPOSOME 1.3 % IJ SUSP
INTRAMUSCULAR | Status: DC | PRN
  Administered 2016-01-18: 20 mL

## 2016-01-18 MED ORDER — DEXAMETHASONE SODIUM PHOSPHATE 10 MG/ML IJ SOLN
INTRAMUSCULAR | Status: DC | PRN
  Administered 2016-01-18: 10 mg via INTRAVENOUS

## 2016-01-18 MED ORDER — LIDOCAINE 2% (20 MG/ML) 5 ML SYRINGE
INTRAMUSCULAR | Status: DC | PRN
  Administered 2016-01-18: 100 mg via INTRAVENOUS

## 2016-01-18 MED ORDER — LACTATED RINGERS IV SOLN
INTRAVENOUS | Status: DC | PRN
  Administered 2016-01-18 (×2): via INTRAVENOUS

## 2016-01-18 MED ORDER — MIDAZOLAM HCL 2 MG/2ML IJ SOLN
INTRAMUSCULAR | Status: AC
  Filled 2016-01-18: qty 2

## 2016-01-18 MED ORDER — BUPIVACAINE LIPOSOME 1.3 % IJ SUSP
20.0000 mL | Freq: Once | INTRAMUSCULAR | Status: DC
  Filled 2016-01-18: qty 20

## 2016-01-18 SURGICAL SUPPLY — 43 items
ADH SKN CLS APL DERMABOND .7 (GAUZE/BANDAGES/DRESSINGS) ×1
APL SKNCLS STERI-STRIP NONHPOA (GAUZE/BANDAGES/DRESSINGS)
APPLICATOR COTTON TIP 6IN STRL (MISCELLANEOUS) ×4 IMPLANT
APPLIER CLIP ROT 10 11.4 M/L (STAPLE) ×2
APR CLP MED LRG 11.4X10 (STAPLE) ×1
BAG SPEC RTRVL 10 TROC 200 (ENDOMECHANICALS) ×1
BENZOIN TINCTURE PRP APPL 2/3 (GAUZE/BANDAGES/DRESSINGS) IMPLANT
CABLE HIGH FREQUENCY MONO STRZ (ELECTRODE) ×2 IMPLANT
CATH REDDICK CHOLANGI 4FR 50CM (CATHETERS) ×2 IMPLANT
CLIP APPLIE ROT 10 11.4 M/L (STAPLE) ×1 IMPLANT
COVER MAYO STAND STRL (DRAPES) ×2 IMPLANT
COVER SURGICAL LIGHT HANDLE (MISCELLANEOUS) ×2 IMPLANT
DECANTER SPIKE VIAL GLASS SM (MISCELLANEOUS) ×2 IMPLANT
DERMABOND ADVANCED (GAUZE/BANDAGES/DRESSINGS) ×1
DERMABOND ADVANCED .7 DNX12 (GAUZE/BANDAGES/DRESSINGS) ×1 IMPLANT
DRAPE C-ARM 42X120 X-RAY (DRAPES) ×2 IMPLANT
ELECT PENCIL ROCKER SW 15FT (MISCELLANEOUS) IMPLANT
ELECT REM PT RETURN 9FT ADLT (ELECTROSURGICAL) ×2
ELECTRODE REM PT RTRN 9FT ADLT (ELECTROSURGICAL) ×1 IMPLANT
GLOVE BIOGEL M 8.0 STRL (GLOVE) ×2 IMPLANT
GOWN STRL REUS W/TWL XL LVL3 (GOWN DISPOSABLE) ×6 IMPLANT
HEMOSTAT SURGICEL 4X8 (HEMOSTASIS) IMPLANT
IRRIG SUCT STRYKERFLOW 2 WTIP (MISCELLANEOUS) ×2
IRRIGATION SUCT STRKRFLW 2 WTP (MISCELLANEOUS) ×1 IMPLANT
IV CATH 14GX2 1/4 (CATHETERS) ×2 IMPLANT
KIT BASIN OR (CUSTOM PROCEDURE TRAY) ×2 IMPLANT
L-HOOK LAP DISP 36CM (ELECTROSURGICAL) ×2
LHOOK LAP DISP 36CM (ELECTROSURGICAL) ×1 IMPLANT
PENCIL BUTTON HOLSTER BLD 10FT (ELECTRODE) ×2 IMPLANT
POUCH RETRIEVAL ECOSAC 10 (ENDOMECHANICALS) ×1 IMPLANT
POUCH RETRIEVAL ECOSAC 10MM (ENDOMECHANICALS) ×1
SCISSORS LAP 5X45 EPIX DISP (ENDOMECHANICALS) ×2 IMPLANT
SLEEVE XCEL OPT CAN 5 100 (ENDOMECHANICALS) ×2 IMPLANT
STAPLER VISISTAT 35W (STAPLE) ×2 IMPLANT
STRIP CLOSURE SKIN 1/2X4 (GAUZE/BANDAGES/DRESSINGS) IMPLANT
SUT VIC AB 4-0 SH 18 (SUTURE) ×2 IMPLANT
SYR 20CC LL (SYRINGE) ×2 IMPLANT
TOWEL OR 17X26 10 PK STRL BLUE (TOWEL DISPOSABLE) ×2 IMPLANT
TRAY LAPAROSCOPIC (CUSTOM PROCEDURE TRAY) ×2 IMPLANT
TROCAR BLADELESS OPT 5 100 (ENDOMECHANICALS) ×2 IMPLANT
TROCAR XCEL BLUNT TIP 100MML (ENDOMECHANICALS) IMPLANT
TROCAR XCEL NON-BLD 11X100MML (ENDOMECHANICALS) ×2 IMPLANT
TUBING INSUF HEATED (TUBING) ×2 IMPLANT

## 2016-01-18 NOTE — Interval H&P Note (Signed)
History and Physical Interval Note:  01/18/2016 7:07 AM  Jim DesanctisErika Uecker  has presented today for surgery, with the diagnosis of chronic cholelithiasis  The various methods of treatment have been discussed with the patient and family. After consideration of risks, benefits and other options for treatment, the patient has consented to  Procedure(s): LAPAROSCOPIC CHOLECYSTECTOMY WITH INTRAOPERATIVE CHOLANGIOGRAM (N/A) as a surgical intervention .  The patient's history has been reviewed, patient examined, no change in status, stable for surgery.  I have reviewed the patient's chart and labs.  Questions were answered to the patient's satisfaction.     Dori Devino B

## 2016-01-18 NOTE — Anesthesia Postprocedure Evaluation (Signed)
Anesthesia Post Note  Patient: Kathy Mason Payment  Procedure(Mason) Performed: Procedure(Mason) (LRB): LAPAROSCOPIC CHOLECYSTECTOMY WITH INTRAOPERATIVE CHOLANGIOGRAM (N/A)  Patient location during evaluation: PACU Anesthesia Type: General Level of consciousness: awake and alert and patient cooperative Pain management: pain level controlled Vital Signs Assessment: post-procedure vital signs reviewed and stable Respiratory status: spontaneous breathing and respiratory function stable Cardiovascular status: stable Anesthetic complications: no       Last Vitals:  Vitals:   01/18/16 0945 01/18/16 0947  BP: (!) 132/91   Pulse: 90 86  Resp: 19 17  Temp:      Last Pain:  Vitals:   01/18/16 0947  TempSrc:   PainSc: 10-Worst pain ever                 Kathy Mason

## 2016-01-18 NOTE — Op Note (Signed)
Kathy Mason   01/18/2016  9:16 AM  Procedure: Laparoscopic Cholecystectomy with intraoperative cholangiogram  Surgeon: Susy FrizzleMatt B. Daphine DeutscherMartin, MD, FACS Asst:  none  Anes:  General  Drains:  None  Findings: Chronic cholecystitis with opaque white shrunken gallbladder and chronic dense adhesions to the fundus and neck  Description of Procedure: The patient was taken to OR 4 and given general anesthesia.  The patient was prepped with technicare and draped sterilely. A time out was performed.  Access to the abdomen was achieved with a 5 mm Optiview through the right upper quadramt.  Port placement included three 5 mm ports and one 12 mm port in the upper abdomen.    The gallbladder was visualized and the fundus was grasped and the gallbladder was elevated. Traction on the infundibulum allowed for successful demonstration of the critical view. Inflammatory changes were chronic and dense.  The cystic duct was identified and clipped up on the gallbladder and an incision was made in the cystic duct and the Reddick catheter was inserted after milking the cystic duct of any debris. A dynamic cholangiogram was performed which demonstrated a long cystic duct, intrahepatic fill and free flow into the duodenum.  No stones were seen.  There was some bleeding at the 18 catheter insertion site controlled with the Bovie.    The cystic duct was then triple clipped and divided, the cystic artery was double clipped and divided and then the gallbladder was removed from the gallbladder bed. Removal of the gallbladder from the gallbladder bed was performed without entering it.  The gallbladder was then placed in a bag and brought out through one of the trocar sites. The gallbladder bed was inspected and no bleeding or bile leaks were seen.     Incisions were injected with Exparel and lidocaine and closed with 4-0 Monocryl and Dermabond on the skin.  Sponge and needle count were correct.    The patient was taken to the  recovery room in satisfactory condition.

## 2016-01-18 NOTE — Addendum Note (Signed)
Addendum  created 01/18/16 1104 by Florene Routeiana L Tameah Mihalko, CRNA   Anesthesia Intra Meds edited

## 2016-01-18 NOTE — Discharge Instructions (Signed)
Laparoscopic Cholecystectomy, Care After °This sheet gives you information about how to care for yourself after your procedure. Your health care provider may also give you more specific instructions. If you have problems or questions, contact your health care provider. °What can I expect after the procedure? °After the procedure, it is common to have: °· Pain at your incision sites. You will be given medicines to control this pain. °· Mild nausea or vomiting. °· Bloating and possible shoulder pain from the air-like gas that was used during the procedure. ° °Follow these instructions at home: °Incision care ° °· Follow instructions from your health care provider about how to take care of your incisions. Make sure you: °? Wash your hands with soap and water before you change your bandage (dressing). If soap and water are not available, use hand sanitizer. °? Change your dressing as told by your health care provider. °? Leave stitches (sutures), skin glue, or adhesive strips in place. These skin closures may need to be in place for 2 weeks or longer. If adhesive strip edges start to loosen and curl up, you may trim the loose edges. Do not remove adhesive strips completely unless your health care provider tells you to do that. °· Do not take baths, swim, or use a hot tub until your health care provider approves. Ask your health care provider if you can take showers. You may only be allowed to take sponge baths for bathing. °· Check your incision area every day for signs of infection. Check for: °? More redness, swelling, or pain. °? More fluid or blood. °? Warmth. °? Pus or a bad smell. °Activity °· Do not drive or use heavy machinery while taking prescription pain medicine. °· Do not lift anything that is heavier than 10 lb (4.5 kg) until your health care provider approves. °· Do not play contact sports until your health care provider approves. °· Do not drive for 24 hours if you were given a medicine to help you relax  (sedative). °· Rest as needed. Do not return to work or school until your health care provider approves. °General instructions °· Take over-the-counter and prescription medicines only as told by your health care provider. °· To prevent or treat constipation while you are taking prescription pain medicine, your health care provider may recommend that you: °? Drink enough fluid to keep your urine clear or pale yellow. °? Take over-the-counter or prescription medicines. °? Eat foods that are high in fiber, such as fresh fruits and vegetables, whole grains, and beans. °? Limit foods that are high in fat and processed sugars, such as fried and sweet foods. °Contact a health care provider if: °· You develop a rash. °· You have more redness, swelling, or pain around your incisions. °· You have more fluid or blood coming from your incisions. °· Your incisions feel warm to the touch. °· You have pus or a bad smell coming from your incisions. °· You have a fever. °· One or more of your incisions breaks open. °Get help right away if: °· You have trouble breathing. °· You have chest pain. °· You have increasing pain in your shoulders. °· You faint or feel dizzy when you stand. °· You have severe pain in your abdomen. °· You have nausea or vomiting that lasts for more than one day. °· You have leg pain. °This information is not intended to replace advice given to you by your health care provider. Make sure you discuss any questions you   your health care provider. Document Released: 12/24/2004 Document Revised: 07/15/2015 Document Reviewed: 06/12/2015 Elsevier Interactive Patient Education  2017 [employer] Laparoscopic Cholecystectomy Laparoscopic cholecystectomy is surgery to remove the gallbladder. The gallbladder is a pear-shaped organ that lies beneath the liver on the right side of the body. The gallbladder stores bile, which is a fluid that helps the body to digest fats. Cholecystectomy is often done for  inflammation of the gallbladder (cholecystitis). This condition is usually caused by a buildup of gallstones (cholelithiasis) in the gallbladder. Gallstones can block the flow of bile, which can result in inflammation and pain. In severe cases, emergency surgery may be required. This procedure is done though small incisions in your abdomen (laparoscopic surgery). A thin scope with a camera (laparoscope) is inserted through one incision. Thin surgical instruments are inserted through the other incisions. In some cases, a laparoscopic procedure may be turned into a type of surgery that is done through a larger incision (open surgery). Tell a health care provider about:  Any allergies you have.  All medicines you are taking, including vitamins, herbs, eye drops, creams, and over-the-counter medicines.  Any problems you or family members have had with anesthetic medicines.  Any blood disorders you have.  Any surgeries you have had.  Any medical conditions you have.  Whether you are pregnant or may be pregnant. What are the risks? Generally, this is a safe procedure. However, problems may occur, including:  Infection.  Bleeding.  Allergic reactions to medicines.  Damage to other structures or organs.  A stone remaining in the common bile duct. The common bile duct carries bile from the gallbladder into the small intestine.  A bile leak from the cyst duct that is clipped when your gallbladder is removed. What happens before the procedure? Staying hydrated  Follow instructions from your health care provider about hydration, which may include:  Up to 2 hours before the procedure - you may continue to drink clear liquids, such as water, clear fruit juice, black coffee, and plain tea. Eating and drinking restrictions  Follow instructions from your health care provider about eating and drinking, which may include:  8 hours before the procedure - stop eating heavy meals or foods such as  meat, fried foods, or fatty foods.  6 hours before the procedure - stop eating light meals or foods, such as toast or cereal.  6 hours before the procedure - stop drinking milk or drinks that contain milk.  2 hours before the procedure - stop drinking clear liquids. Medicines  Ask your health care provider about:  Changing or stopping your regular medicines. This is especially important if you are taking diabetes medicines or blood thinners.  Taking medicines such as aspirin and ibuprofen. These medicines can thin your blood. Do not take these medicines before your procedure if your health care provider instructs you not to.  You may be given antibiotic medicine to help prevent infection. General instructions  Let your health care provider know if you develop a cold or an infection before surgery.  Plan to have someone take you home from the hospital or clinic.  Ask your health care provider how your surgical site will be marked or identified. What happens during the procedure?  To reduce your risk of infection:  Your health care team will wash or sanitize their hands.  Your skin will be washed with soap.  Hair may be removed from the surgical area.  An IV tube may be inserted into one of  your veins.  You will be given one or more of the following:  A medicine to help you relax (sedative).  A medicine to make you fall asleep (general anesthetic).  A breathing tube will be placed in your mouth.  Your surgeon will make several small cuts (incisions) in your abdomen.  The laparoscope will be inserted through one of the small incisions. The camera on the laparoscope will send images to a TV screen (monitor) in the operating room. This lets your surgeon see inside your abdomen.  Air-like gas will be pumped into your abdomen. This will expand your abdomen to give the surgeon more room to perform the surgery.  Other tools that are needed for the procedure will be inserted  through the other incisions. The gallbladder will be removed through one of the incisions.  Your common bile duct may be examined. If stones are found in the common bile duct, they may be removed.  After your gallbladder has been removed, the incisions will be closed with stitches (sutures), staples, or skin glue.  Your incisions may be covered with a bandage (dressing). The procedure may vary among health care providers and hospitals. What happens after the procedure?  Your blood pressure, heart rate, breathing rate, and blood oxygen level will be monitored until the medicines you were given have worn off.  You will be given medicines as needed to control your pain.  Do not drive for 24 hours if you were given a sedative. This information is not intended to replace advice given to you by your health care provider. Make sure you discuss any questions you have with your health care provider. Document Released: 12/24/2004 Document Revised: 07/16/2015 Document Reviewed: 06/12/2015 Elsevier Interactive Patient Education  2017 [employer]

## 2016-01-18 NOTE — Transfer of Care (Signed)
Immediate Anesthesia Transfer of Care Note  Patient: Kathy Mason  Procedure(s) Performed: Procedure(s): LAPAROSCOPIC CHOLECYSTECTOMY WITH INTRAOPERATIVE CHOLANGIOGRAM (N/A)  Patient Location: PACU  Anesthesia Type:General  Level of Consciousness: awake  Airway & Oxygen Therapy: Patient Spontanous Breathing and Patient connected to face mask oxygen  Post-op Assessment: Report given to RN and Post -op Vital signs reviewed and stable  Post vital signs: Reviewed and stable  Last Vitals:  Vitals:   01/18/16 0558  BP: 128/78  Pulse: 91  Resp: 18  Temp: 36.8 C    Last Pain:  Vitals:   01/18/16 0558  TempSrc: Oral      Patients Stated Pain Goal: 5 (01/18/16 0545)  Complications: No apparent anesthesia complications

## 2016-01-18 NOTE — Anesthesia Preprocedure Evaluation (Signed)
Anesthesia Evaluation  Patient identified by MRN, date of birth, ID band Patient awake    Reviewed: Allergy & Precautions, H&P , NPO status , Patient's Chart, lab work & pertinent test results  Airway Mallampati: II   Neck ROM: full    Dental   Pulmonary neg pulmonary ROS,    breath sounds clear to auscultation       Cardiovascular negative cardio ROS   Rhythm:regular Rate:Normal     Neuro/Psych    GI/Hepatic   Endo/Other  Morbid obesity  Renal/GU      Musculoskeletal   Abdominal   Peds  Hematology   Anesthesia Other Findings   Reproductive/Obstetrics PCOS                             Anesthesia Physical Anesthesia Plan  ASA: II  Anesthesia Plan: General   Post-op Pain Management:    Induction: Intravenous  Airway Management Planned: Oral ETT  Additional Equipment:   Intra-op Plan:   Post-operative Plan: Extubation in OR  Informed Consent: I have reviewed the patients History and Physical, chart, labs and discussed the procedure including the risks, benefits and alternatives for the proposed anesthesia with the patient or authorized representative who has indicated his/her understanding and acceptance.     Plan Discussed with: CRNA, Anesthesiologist and Surgeon  Anesthesia Plan Comments:         Anesthesia Quick Evaluation

## 2016-01-18 NOTE — Anesthesia Procedure Notes (Signed)
Procedure Name: Intubation Date/Time: 01/18/2016 7:26 AM Performed by: Leroy LibmanEARDON, Kathy Wagoner L Patient Re-evaluated:Patient Re-evaluated prior to inductionOxygen Delivery Method: Circle system utilized Preoxygenation: Pre-oxygenation with 100% oxygen Intubation Type: IV induction Ventilation: Mask ventilation without difficulty and Oral airway inserted - appropriate to patient size Laryngoscope Size: Hyacinth MeekerMiller and 2 Grade View: Grade I Tube type: Oral Tube size: 7.5 mm Airway Equipment and Method: Stylet Placement Confirmation: ETT inserted through vocal cords under direct vision,  positive ETCO2 and breath sounds checked- equal and bilateral Secured at: 22 cm Tube secured with: Tape Dental Injury: Teeth and Oropharynx as per pre-operative assessment

## 2016-10-09 IMAGING — US US ABDOMEN COMPLETE
1 series · 14 of 25 positions shown · non-contrast
Comparison: None.

CLINICAL DATA: Epigastric pain and elevated liver function tests.
Pain for 2 weeks but worse today. White cell count 11.3.

EXAM:
ABDOMEN ULTRASOUND COMPLETE

[Series 1: us abdomen complete · 0.25mm/px · 14 of 96 slices shown]
[im 1/96]
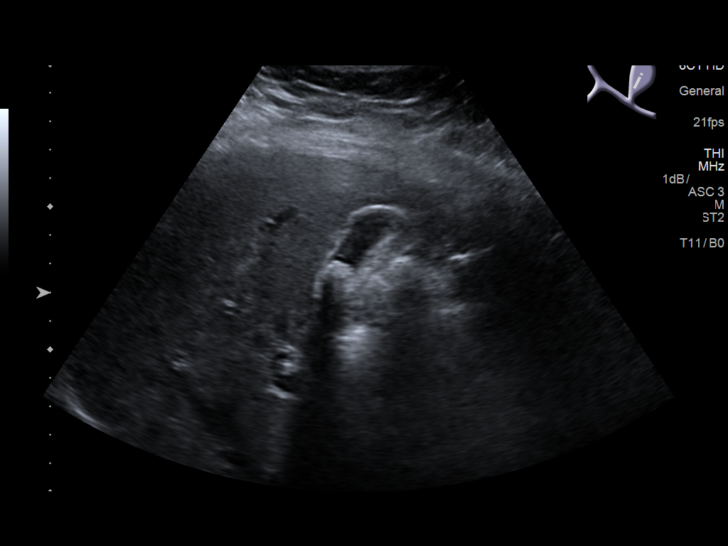
[im 8/96]
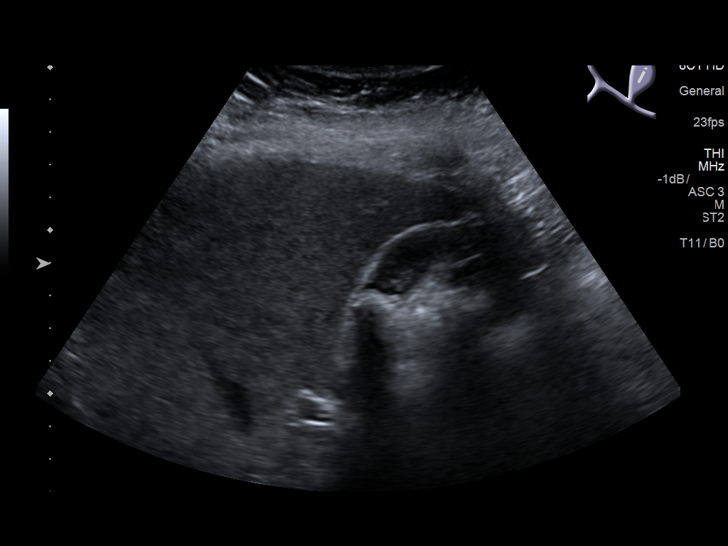
[im 16/96]
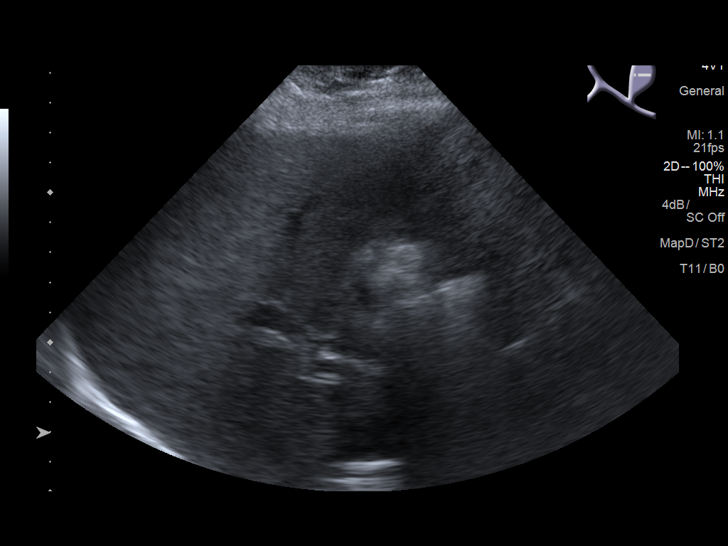
[im 24/96]
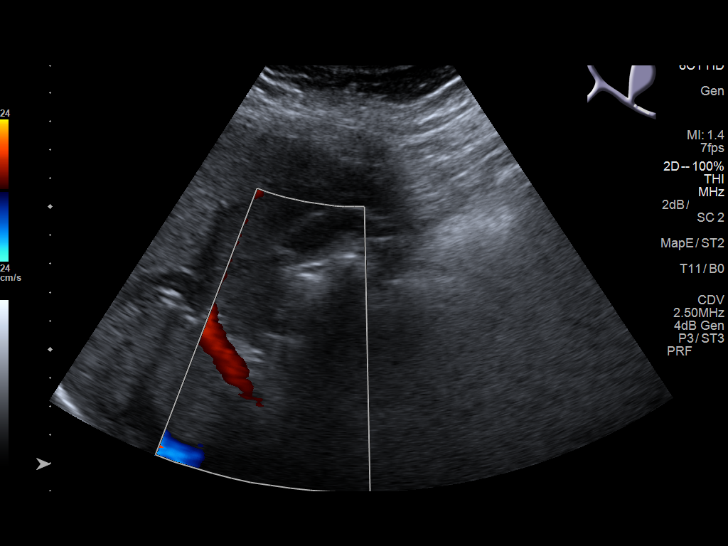
[im 32/96]
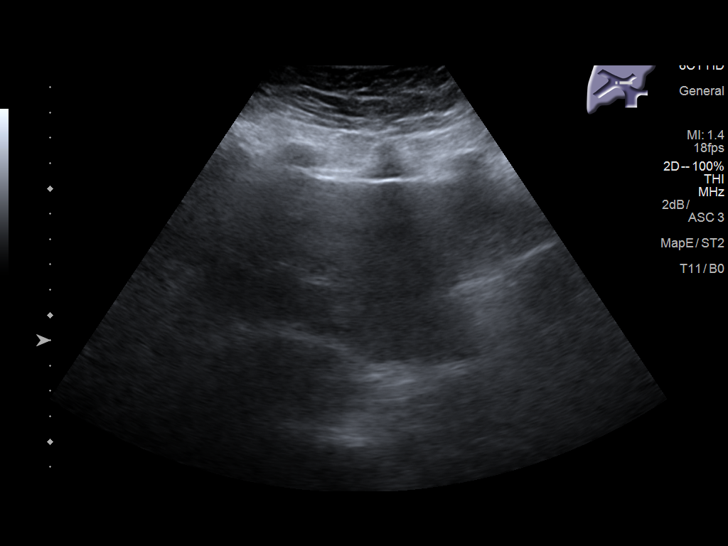
[im 36/96]
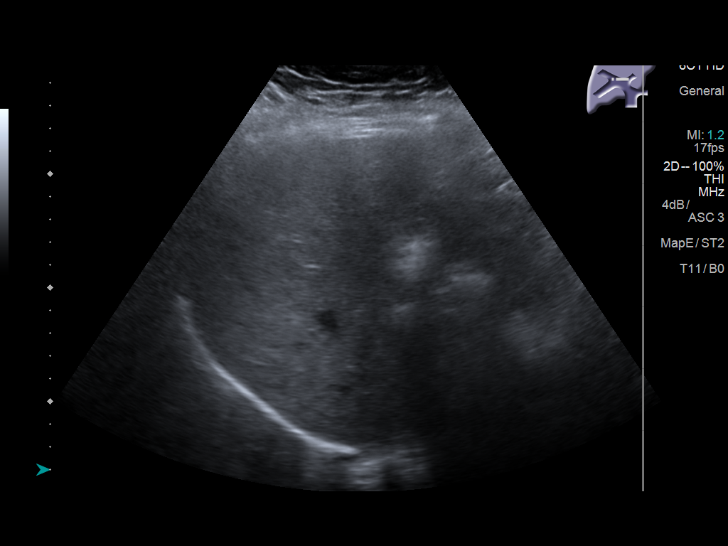
[im 44/96]
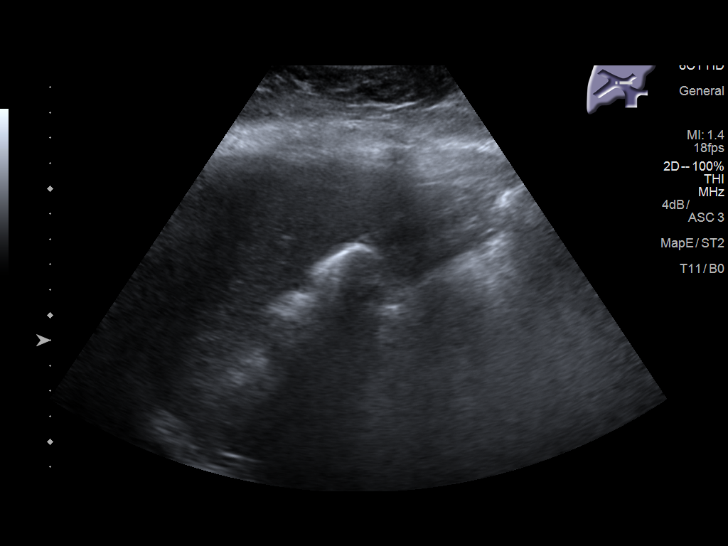
[im 52/96]
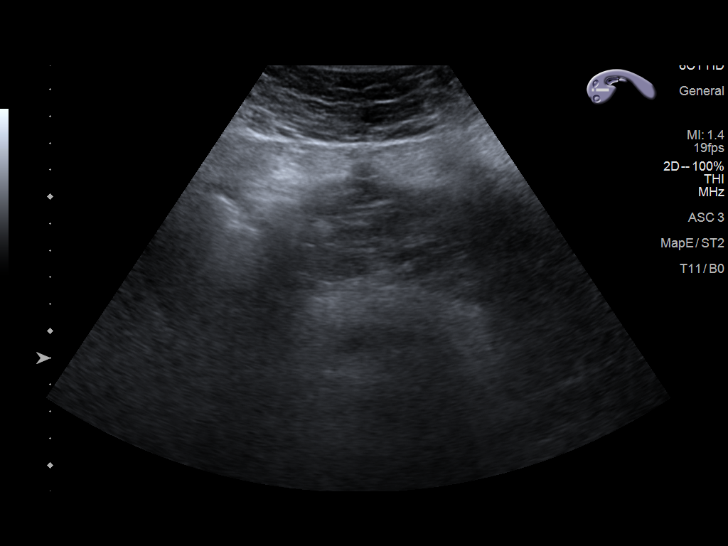
[im 60/96]
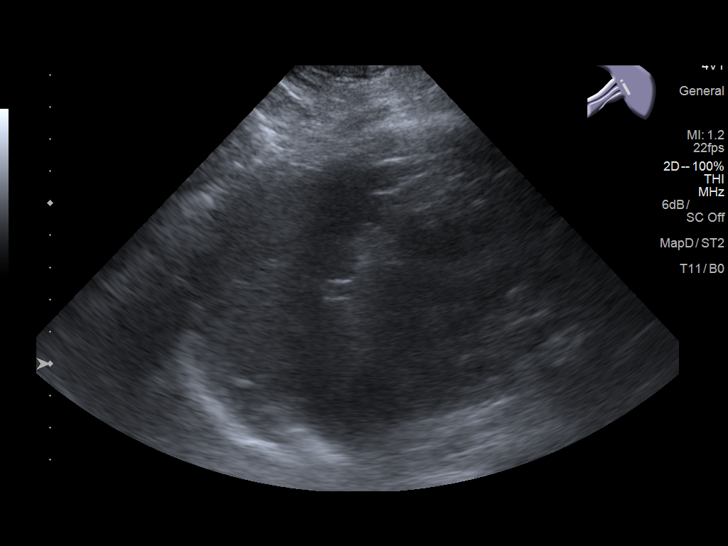
[im 64/96]
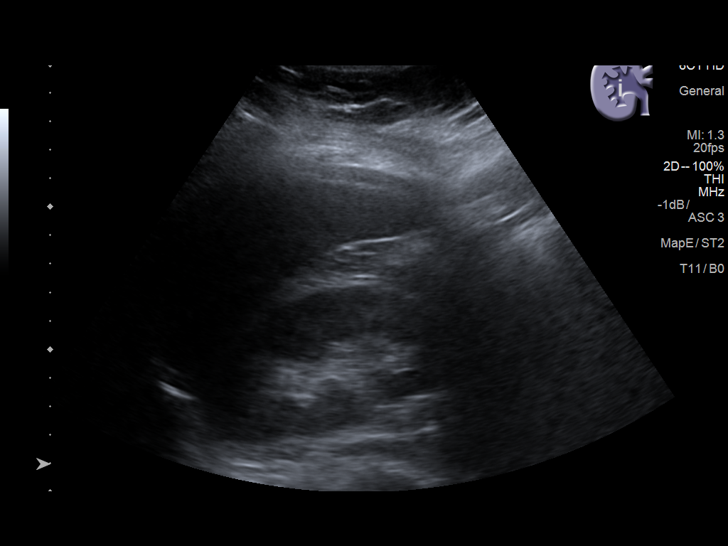
[im 72/96]
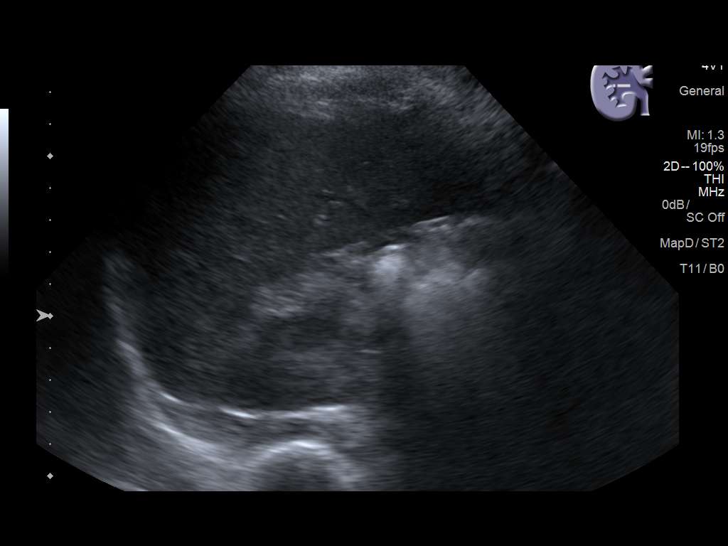
[im 80/96]
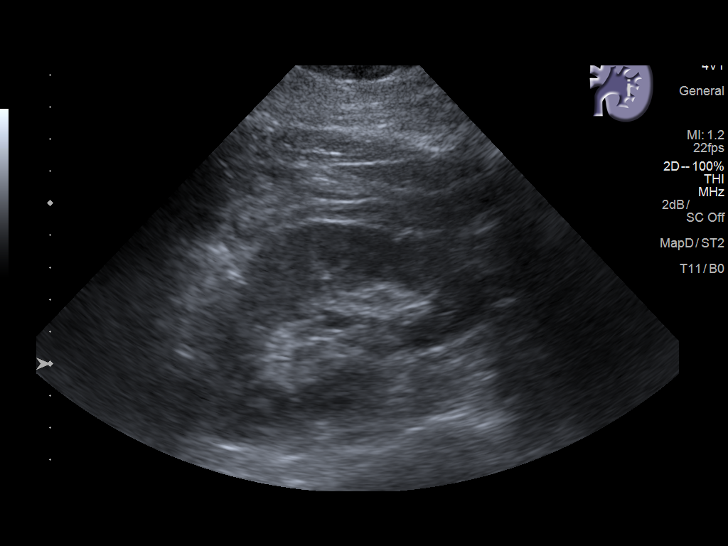
[im 88/96]
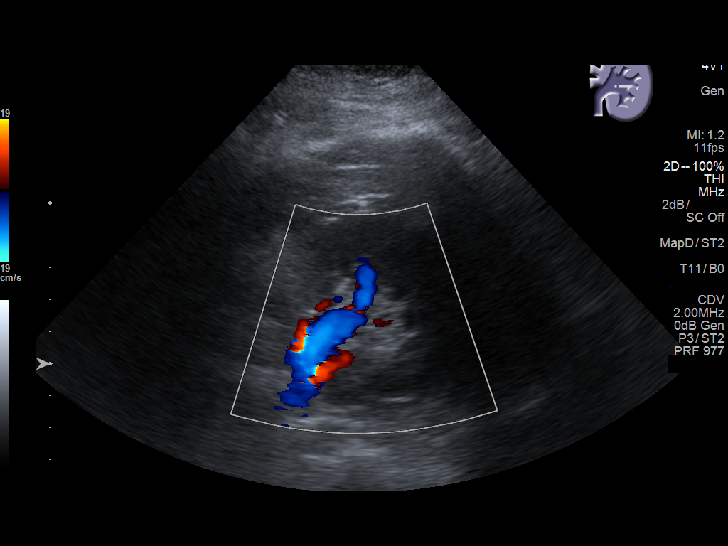
[im 96/96]
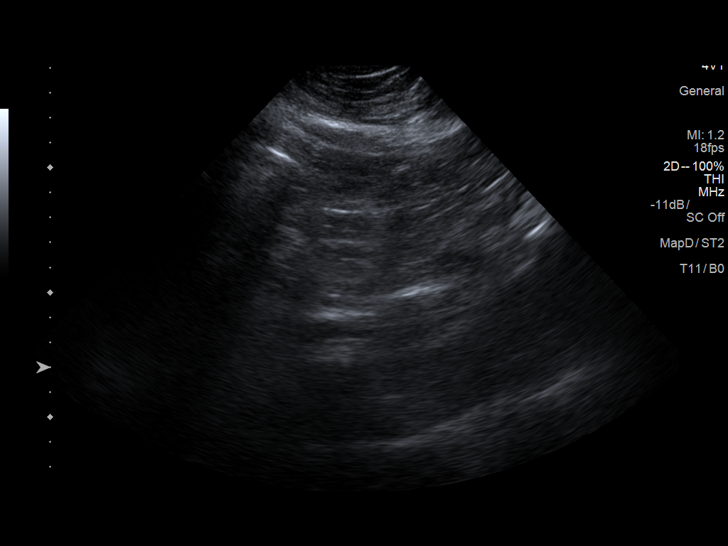

[14 of 25 positions shown; findings below may reference images not displayed]

FINDINGS: Gallbladder: Cholelithiasis with multiple stones and sludge in the
gallbladder. Gallbladder is contracted. No gallbladder wall
thickening. Murphy's sign is negative.

Common bile duct: Diameter: 4.1 mm, normal

Liver: Increased liver parenchymal echotexture suggesting fatty
infiltration. No focal lesions identified.

IVC: No abnormality visualized.

Pancreas: Not well visualized due to overlying bowel gas.

Spleen: Size and appearance within normal limits.

Right Kidney: Length: 10.6 cm. Echogenicity within normal limits. No
mass or hydronephrosis visualized.

Left Kidney: Length: 10.3 cm. Echogenicity within normal limits. No
mass or hydronephrosis visualized.

Abdominal aorta: No aneurysm visualized.

Other findings: None.
IMPRESSION: Cholelithiasis with stones and sludge in the gallbladder. No other
changes to suggest cholecystitis. Fatty infiltration of the liver.

## 2017-01-27 ENCOUNTER — Other Ambulatory Visit: Payer: Self-pay | Admitting: Obstetrics and Gynecology

## 2017-01-27 ENCOUNTER — Other Ambulatory Visit (HOSPITAL_COMMUNITY)
Admission: RE | Admit: 2017-01-27 | Discharge: 2017-01-27 | Disposition: A | Discharge status: Home / Self Care | Payer: BLUE CROSS/BLUE SHIELD | Source: Ambulatory Visit | Attending: Obstetrics and Gynecology | Admitting: Obstetrics and Gynecology

## 2017-01-27 DIAGNOSIS — Z01419 Encounter for gynecological examination (general) (routine) without abnormal findings: Secondary | ICD-10-CM | POA: Diagnosis not present

## 2017-01-31 LAB — CYTOLOGY - PAP
DIAGNOSIS: NEGATIVE
HPV (WINDOPATH): DETECTED — AB
HPV 16/18/45 genotyping: NEGATIVE

## 2017-05-21 IMAGING — RF DG CHOLANGIOGRAM OPERATIVE
1 series · 4 of 4 positions shown · non-contrast
Comparison: None.

CLINICAL DATA: Right upper abdominal pain, cholelithiasis
ultrasound 10/12/2015

EXAM:
INTRAOPERATIVE CHOLANGIOGRAM
TECHNIQUE: Cholangiographic images from the C-arm fluoroscopic device were
submitted for interpretation post-operatively. Please see the
procedural report for the amount of contrast and the fluoroscopy
time utilized.

[Series 1: run · 4 of 66 frames shown]
[frame 10/66]
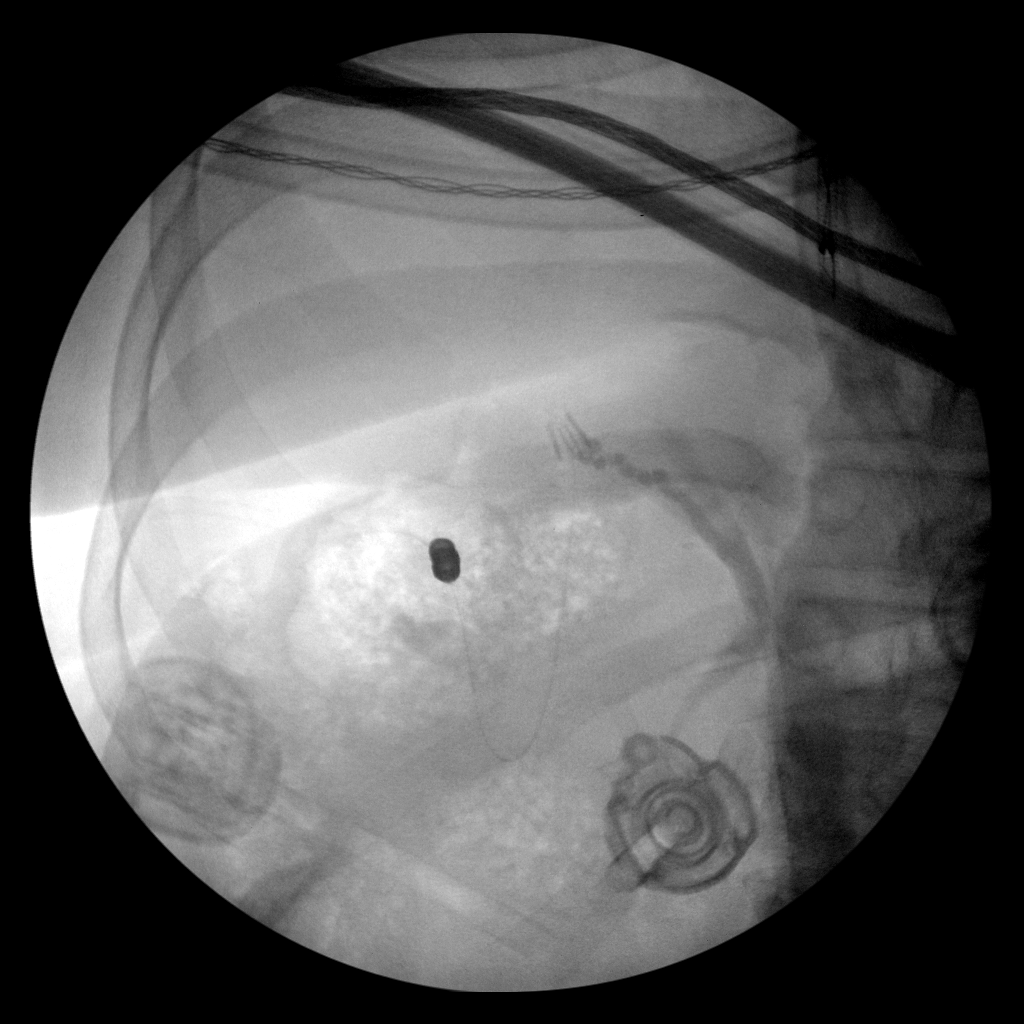
[frame 34/66]
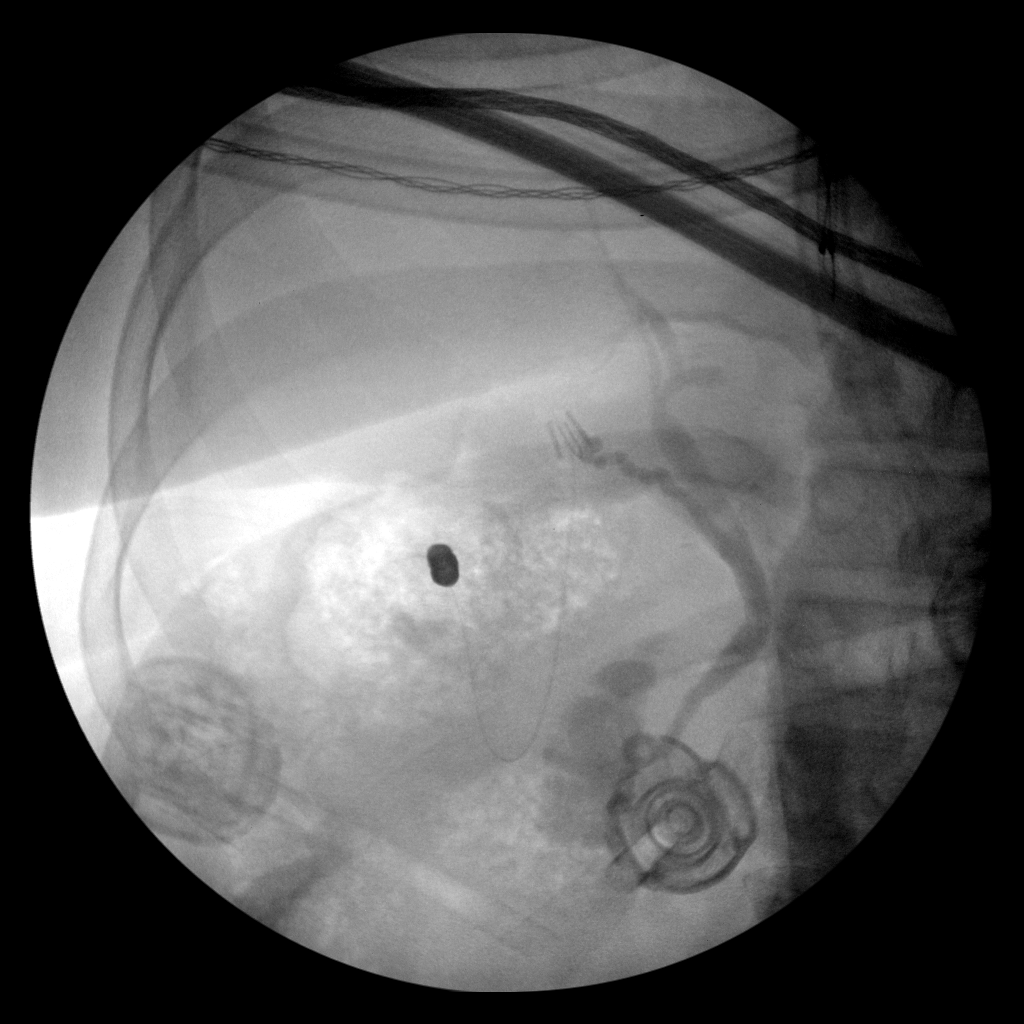
[frame 57/66]
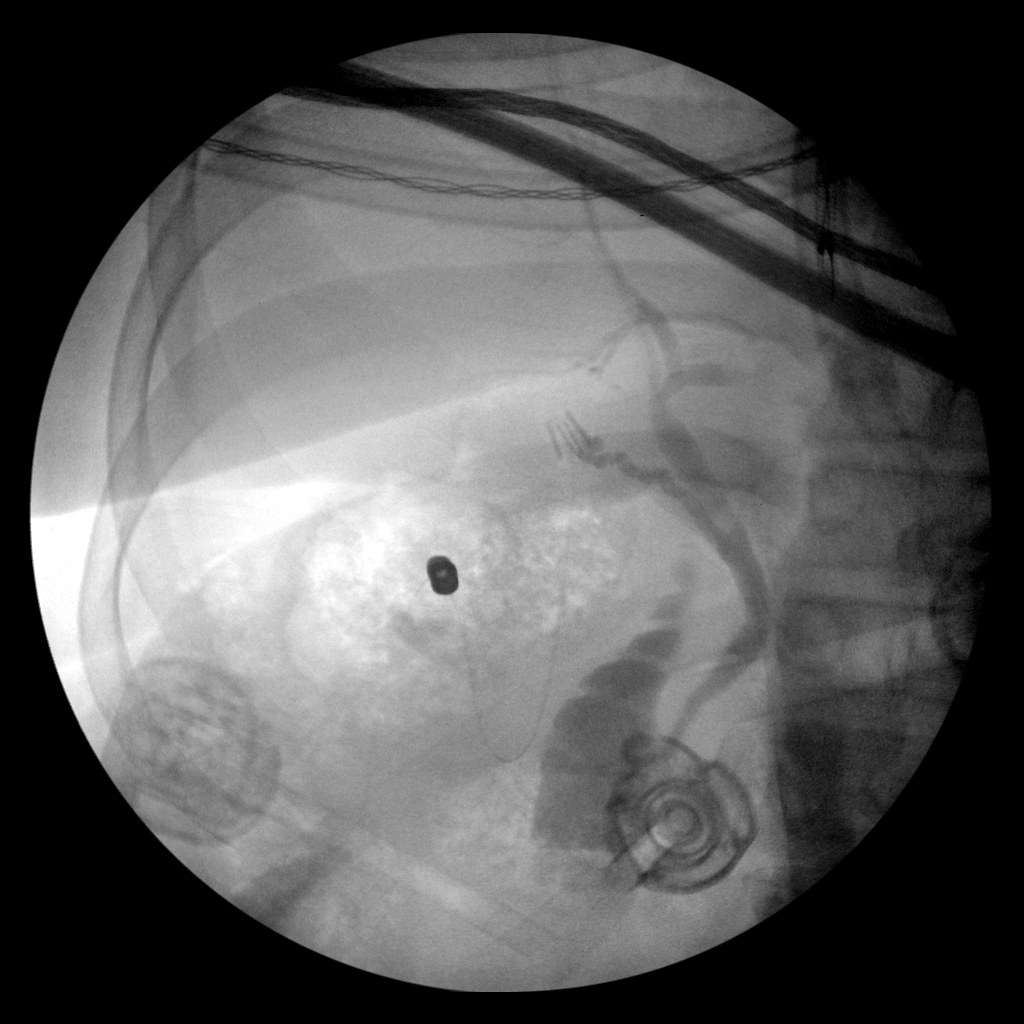
[frame 65/66]
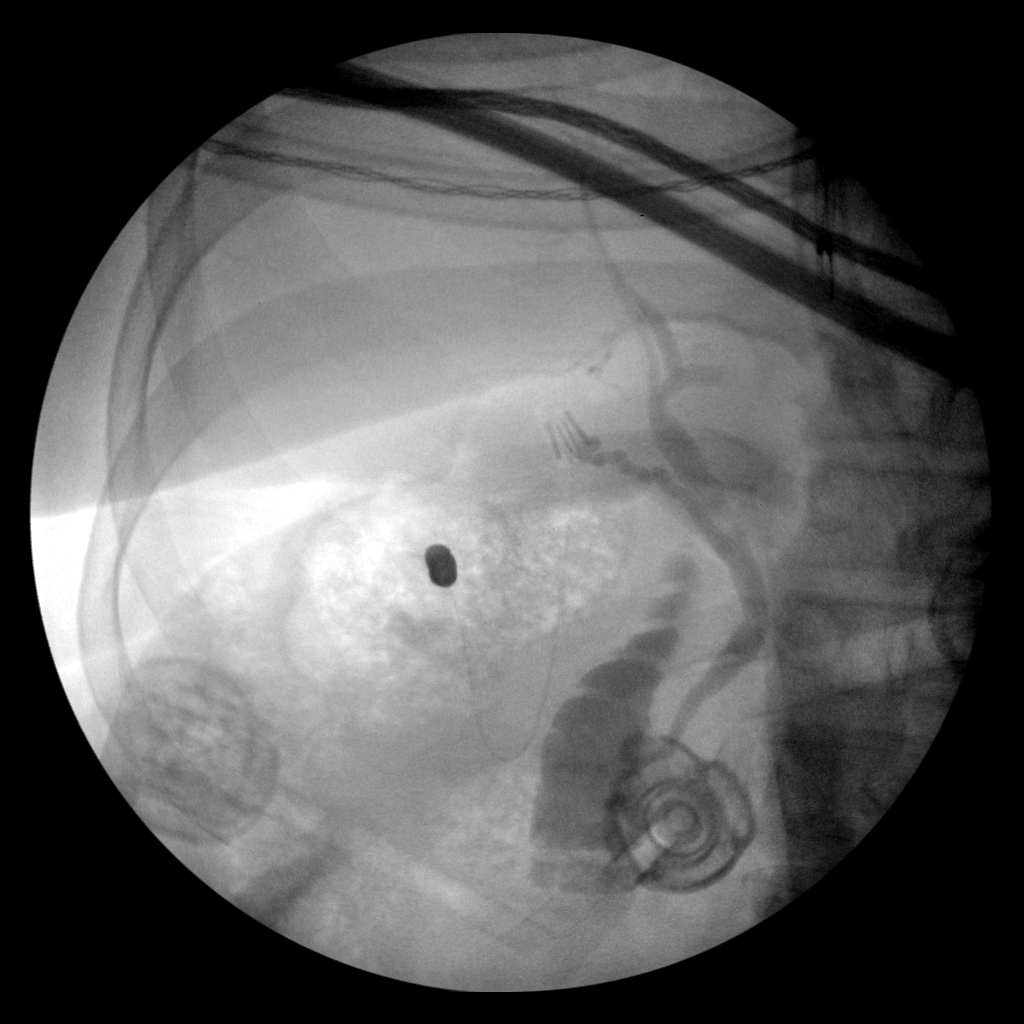

[4 of 4 positions shown; findings below may reference images not displayed]

FINDINGS: No persistent filling defects in the common duct. Intrahepatic ducts
are incompletely visualized, appearing decompressed centrally.
Contrast passes into the duodenum.

:
Negative for retained common duct stone.

## 2018-01-30 ENCOUNTER — Other Ambulatory Visit (HOSPITAL_COMMUNITY)
Admission: RE | Admit: 2018-01-30 | Discharge: 2018-01-30 | Disposition: A | Discharge status: Home / Self Care | Payer: BLUE CROSS/BLUE SHIELD | Source: Ambulatory Visit | Attending: Obstetrics and Gynecology | Admitting: Obstetrics and Gynecology

## 2018-01-30 ENCOUNTER — Other Ambulatory Visit: Payer: Self-pay | Admitting: Obstetrics and Gynecology

## 2018-01-30 DIAGNOSIS — Z01419 Encounter for gynecological examination (general) (routine) without abnormal findings: Secondary | ICD-10-CM | POA: Insufficient documentation

## 2018-02-03 LAB — CYTOLOGY - PAP
DIAGNOSIS: NEGATIVE
HPV (WINDOPATH): NOT DETECTED

## 2018-11-02 LAB — OB RESULTS CONSOLE GC/CHLAMYDIA
Chlamydia: NEGATIVE
Gonorrhea: NEGATIVE

## 2018-11-02 LAB — OB RESULTS CONSOLE RPR: RPR: NONREACTIVE

## 2018-11-02 LAB — OB RESULTS CONSOLE HEPATITIS B SURFACE ANTIGEN: Hepatitis B Surface Ag: NEGATIVE

## 2018-11-02 LAB — OB RESULTS CONSOLE ABO/RH: RH Type: POSITIVE

## 2018-11-02 LAB — OB RESULTS CONSOLE ANTIBODY SCREEN: Antibody Screen: NEGATIVE

## 2018-11-02 LAB — OB RESULTS CONSOLE RUBELLA ANTIBODY, IGM: Rubella: IMMUNE

## 2018-11-02 LAB — OB RESULTS CONSOLE HIV ANTIBODY (ROUTINE TESTING): HIV: NONREACTIVE

## 2019-03-31 ENCOUNTER — Encounter: Payer: Medicaid Other | Attending: Obstetrics and Gynecology | Admitting: Registered"

## 2019-03-31 DIAGNOSIS — O99810 Abnormal glucose complicating pregnancy: Secondary | ICD-10-CM | POA: Insufficient documentation

## 2019-04-07 ENCOUNTER — Encounter: Payer: Medicaid Other | Admitting: Registered"

## 2019-04-07 ENCOUNTER — Other Ambulatory Visit: Payer: Self-pay

## 2019-04-07 DIAGNOSIS — O99810 Abnormal glucose complicating pregnancy: Secondary | ICD-10-CM | POA: Diagnosis present

## 2019-04-08 ENCOUNTER — Encounter: Payer: Self-pay | Admitting: Registered"

## 2019-04-08 DIAGNOSIS — O99810 Abnormal glucose complicating pregnancy: Secondary | ICD-10-CM | POA: Insufficient documentation

## 2019-04-08 NOTE — Progress Notes (Signed)
Patient was seen on 04/07/19 for Gestational Diabetes self-management class at the [facility]. The following learning objectives were met by the patient during this course:   States the definition of Gestational Diabetes  States why dietary management is important in controlling blood glucose  Describes the effects each nutrient has on blood glucose levels  Demonstrates ability to create a balanced meal plan  Demonstrates carbohydrate counting   States when to check blood glucose levels  Demonstrates proper blood glucose monitoring techniques  States the effect of stress and exercise on blood glucose levels  States the importance of limiting caffeine and abstaining from alcohol and smoking  Blood glucose monitor given: patient brought meter to class Blood glucose reading: 97 mg/dL  Patient instructed to monitor glucose levels: FBS: 60 - <95; 1 hour: <140; 2 hour: <120  Patient received handouts:  Nutrition Diabetes and Pregnancy, including carb counting list  Patient will be seen for follow-up as needed.

## 2019-05-18 LAB — OB RESULTS CONSOLE GBS: GBS: NEGATIVE

## 2019-05-26 ENCOUNTER — Encounter (HOSPITAL_COMMUNITY): Payer: Self-pay | Admitting: *Deleted

## 2019-05-26 ENCOUNTER — Telehealth (HOSPITAL_COMMUNITY): Payer: Self-pay | Admitting: *Deleted

## 2019-05-26 NOTE — Telephone Encounter (Signed)
Preadmission screen  

## 2019-05-27 ENCOUNTER — Telehealth (HOSPITAL_COMMUNITY): Payer: Self-pay | Admitting: *Deleted

## 2019-05-27 NOTE — Telephone Encounter (Signed)
Preadmission screen  

## 2019-06-01 ENCOUNTER — Telehealth (HOSPITAL_COMMUNITY): Payer: Self-pay | Admitting: *Deleted

## 2019-06-01 NOTE — Telephone Encounter (Signed)
Preadmission screen  

## 2019-06-02 ENCOUNTER — Encounter (HOSPITAL_COMMUNITY): Payer: Self-pay | Admitting: *Deleted

## 2019-06-02 ENCOUNTER — Telehealth (HOSPITAL_COMMUNITY): Payer: Self-pay | Admitting: *Deleted

## 2019-06-02 NOTE — Telephone Encounter (Signed)
Preadmission screen  

## 2019-06-08 ENCOUNTER — Other Ambulatory Visit: Payer: Self-pay | Admitting: Obstetrics and Gynecology

## 2019-06-08 ENCOUNTER — Other Ambulatory Visit (HOSPITAL_COMMUNITY)
Admission: RE | Admit: 2019-06-08 | Discharge: 2019-06-08 | Disposition: A | Discharge status: Home / Self Care | Payer: Medicaid Other | Source: Ambulatory Visit | Attending: Obstetrics and Gynecology | Admitting: Obstetrics and Gynecology

## 2019-06-08 LAB — SARS CORONAVIRUS 2 (TAT 6-24 HRS): SARS Coronavirus 2: NEGATIVE

## 2019-06-08 NOTE — H&P (Signed)
Kathy Mason is a 34 y.o. female presenting for Induction of labor at 39 wks and 0 days due to A2 DM. Pregnancy also complicated by HSV2 infection , anxiety and depression. Prenatal care provided by Dr. Christophe Louis with [facility]  U/S for EFW on 05/25/2019 6 lbs 6 oz at the 56%ile . OB History    Gravida  1   Para      Term      Preterm      AB      Living        SAB      TAB      Ectopic      Multiple      Live Births             Past Medical History:  Diagnosis Date  . Depression   . PCOS (polycystic ovarian syndrome)   . Vaginal Pap smear, abnormal    HPV   Past Surgical History:  Procedure Laterality Date  . ANKLE FRACTURE SURGERY    . CHOLECYSTECTOMY N/A 01/18/2016   Procedure: LAPAROSCOPIC CHOLECYSTECTOMY WITH INTRAOPERATIVE CHOLANGIOGRAM;  Surgeon: Johnathan Hausen, MD;  Location: [facility]S;  Service: General;  Laterality: N/A;  . WISDOM TOOTH EXTRACTION     Family History: family history includes Breast cancer in her maternal grandmother; Diabetes in her brother, father, maternal grandmother, and mother; Hypertension in her brother, father, maternal grandfather, maternal grandmother, and mother; Kidney disease in her mother; Sarcoidosis in her brother; Stroke in her mother. Social History:  reports that she has never smoked. She has never used smokeless tobacco. She reports that she does not use drugs. No history on file for alcohol.     Maternal Diabetes: Yes:  Diabetes Type:  Insulin/Medication controlled Genetic Screening: Normal Maternal Ultrasounds/Referrals: Normal Fetal Ultrasounds or other Referrals:  None Maternal Substance Abuse:  No Significant Maternal Medications:  Meds include: Zoloft Other:  Significant Maternal Lab Results:  Group B Strep negative Other Comments:  [name] has significant anxiety   Review of Systems  Constitutional: Negative.   HENT: Negative.   Eyes: Negative.   Respiratory: Negative.   Cardiovascular: Negative.    Gastrointestinal: Negative.   Endocrine: Negative.   Genitourinary: Negative.   Musculoskeletal: Negative.   Skin: Negative.   Allergic/Immunologic: Negative.   Neurological: Negative.   Hematological: Negative.   Psychiatric/Behavioral: The patient is nervous/anxious.    History   Last menstrual period 08/20/2018. Exam Physical Exam  Vitals reviewed. Constitutional: She is oriented to person, place, and time. She appears well-developed and well-nourished.  HENT:  Head: Normocephalic and atraumatic.  Eyes: Pupils are equal, round, and reactive to light. Conjunctivae are normal.  Cardiovascular: Normal rate and regular rhythm.  Respiratory: Effort normal and breath sounds normal.  GI: There is no abdominal tenderness.  Genitourinary:    Vagina normal.   Musculoskeletal:        General: Edema present. Normal range of motion.     Cervical back: Normal range of motion and neck supple.  Neurological: She is alert and oriented to person, place, and time. She has normal reflexes.  Skin: Skin is warm and dry.  Psychiatric: She has a normal mood and affect.    Prenatal labs: ABO, Rh: O/Positive/-- (10/26 0000) Antibody: Negative (10/26 0000) Rubella: Immune (10/26 0000) RPR: Nonreactive (10/26 0000)  HBsAg: Negative (10/26 0000)  HIV: Non-reactive (10/26 0000)  GBS: Negative/-- (05/11 0000)   Assessment/Plan: 39 wks and 0 days with A2 DM on glyburide  for induction of labor  - Cytotec for cervical ripening  HSV 2 Infection  - valtrex for suppression.- no lesions in the office on exam  A2 DM - bs well controlled with Glyburide 2.5 mg will hold during induction / Check cbg q 4 hours Anxiety/Depression  - Zoloft /Social work consult postpartum   CCOB coveriing midnight until 7am on June 2nd. I will assume care at 7am  On June 2nd.     Gerald Leitz 06/08/2019, 5:33 PM

## 2019-06-08 NOTE — H&P (Deleted)
  The note originally documented on this encounter has been moved the the encounter in which it belongs.  

## 2019-06-09 ENCOUNTER — Inpatient Hospital Stay (HOSPITAL_COMMUNITY): Payer: Medicaid Other

## 2019-06-09 ENCOUNTER — Encounter (HOSPITAL_COMMUNITY): Payer: Self-pay | Admitting: Obstetrics and Gynecology

## 2019-06-09 ENCOUNTER — Encounter (HOSPITAL_COMMUNITY)
Admission: AD | Disposition: A | Discharge status: Home / Self Care | Payer: Self-pay | Source: Home / Self Care | Attending: Obstetrics and Gynecology

## 2019-06-09 ENCOUNTER — Inpatient Hospital Stay (HOSPITAL_COMMUNITY)
Admission: AD | Admit: 2019-06-09 | Discharge: 2019-06-12 | DRG: 787 | Disposition: A | Discharge status: Home / Self Care | Payer: Medicaid Other | Attending: Obstetrics and Gynecology | Admitting: Obstetrics and Gynecology

## 2019-06-09 ENCOUNTER — Other Ambulatory Visit: Payer: Self-pay

## 2019-06-09 ENCOUNTER — Inpatient Hospital Stay (HOSPITAL_COMMUNITY): Payer: Medicaid Other | Admitting: Anesthesiology

## 2019-06-09 DIAGNOSIS — O99214 Obesity complicating childbirth: Secondary | ICD-10-CM | POA: Diagnosis present

## 2019-06-09 DIAGNOSIS — O134 Gestational [pregnancy-induced] hypertension without significant proteinuria, complicating childbirth: Secondary | ICD-10-CM | POA: Diagnosis present

## 2019-06-09 DIAGNOSIS — O24425 Gestational diabetes mellitus in childbirth, controlled by oral hypoglycemic drugs: Secondary | ICD-10-CM | POA: Diagnosis present

## 2019-06-09 DIAGNOSIS — F419 Anxiety disorder, unspecified: Secondary | ICD-10-CM | POA: Diagnosis present

## 2019-06-09 DIAGNOSIS — O99344 Other mental disorders complicating childbirth: Secondary | ICD-10-CM | POA: Diagnosis present

## 2019-06-09 DIAGNOSIS — O9832 Other infections with a predominantly sexual mode of transmission complicating childbirth: Secondary | ICD-10-CM | POA: Diagnosis present

## 2019-06-09 DIAGNOSIS — A6000 Herpesviral infection of urogenital system, unspecified: Secondary | ICD-10-CM | POA: Diagnosis present

## 2019-06-09 DIAGNOSIS — Z3A39 39 weeks gestation of pregnancy: Secondary | ICD-10-CM | POA: Diagnosis not present

## 2019-06-09 DIAGNOSIS — F329 Major depressive disorder, single episode, unspecified: Secondary | ICD-10-CM | POA: Diagnosis present

## 2019-06-09 DIAGNOSIS — Z20822 Contact with and (suspected) exposure to covid-19: Secondary | ICD-10-CM | POA: Diagnosis present

## 2019-06-09 DIAGNOSIS — O24419 Gestational diabetes mellitus in pregnancy, unspecified control: Secondary | ICD-10-CM | POA: Diagnosis present

## 2019-06-09 DIAGNOSIS — Z98891 History of uterine scar from previous surgery: Secondary | ICD-10-CM

## 2019-06-09 DIAGNOSIS — O24435 Gestational diabetes mellitus in puerperium, controlled by oral hypoglycemic drugs: Secondary | ICD-10-CM | POA: Diagnosis present

## 2019-06-09 LAB — COMPREHENSIVE METABOLIC PANEL
ALT: 17 U/L (ref 0–44)
AST: 19 U/L (ref 15–41)
Albumin: 2.2 g/dL — ABNORMAL LOW (ref 3.5–5.0)
Alkaline Phosphatase: 198 U/L — ABNORMAL HIGH (ref 38–126)
Anion gap: 9 (ref 5–15)
BUN: 14 mg/dL (ref 6–20)
CO2: 20 mmol/L — ABNORMAL LOW (ref 22–32)
Calcium: 9.7 mg/dL (ref 8.9–10.3)
Chloride: 107 mmol/L (ref 98–111)
Creatinine, Ser: 0.76 mg/dL (ref 0.44–1.00)
GFR calc Af Amer: >60 mL/min (ref >60)
GFR calc non Af Amer: >60 mL/min (ref >60)
Glucose, Bld: 99 mg/dL (ref 70–99)
Potassium: 4.3 mmol/L (ref 3.5–5.1)
Sodium: 136 mmol/L (ref 135–145)
Total Bilirubin: 0.5 mg/dL (ref 0.3–1.2)
Total Protein: 5.7 g/dL — ABNORMAL LOW (ref 6.5–8.1)

## 2019-06-09 LAB — CBC
HCT: 38.2 % (ref 36.0–46.0)
Hemoglobin: 12 g/dL (ref 12.0–15.0)
MCH: 23.2 pg — ABNORMAL LOW (ref 26.0–34.0)
MCHC: 31.4 g/dL (ref 30.0–36.0)
MCV: 73.7 fL — ABNORMAL LOW (ref 80.0–100.0)
Platelets: 205 10*3/uL (ref 150–400)
RBC: 5.18 MIL/uL — ABNORMAL HIGH (ref 3.87–5.11)
RDW: 15.6 % — ABNORMAL HIGH (ref 11.5–15.5)
WBC: 8.7 10*3/uL (ref 4.0–10.5)
nRBC: 0 % (ref 0.0–0.2)

## 2019-06-09 LAB — GLUCOSE, CAPILLARY
Glucose-Capillary: 94 mg/dL (ref 70–99)
Glucose-Capillary: 150 mg/dL — ABNORMAL HIGH (ref 70–99)
Glucose-Capillary: 78 mg/dL (ref 70–99)

## 2019-06-09 LAB — TYPE AND SCREEN
ABO/RH(D): O POS
Antibody Screen: NEGATIVE

## 2019-06-09 LAB — PROTEIN / CREATININE RATIO, URINE
Creatinine, Urine: 28.28 mg/dL
Total Protein, Urine: <6 mg/dL

## 2019-06-09 LAB — ABO/RH: ABO/RH(D): O POS

## 2019-06-09 LAB — RPR: RPR Ser Ql: NONREACTIVE

## 2019-06-09 LAB — URIC ACID: Uric Acid, Serum: 5.2 mg/dL (ref 2.5–7.1)

## 2019-06-09 LAB — LACTATE DEHYDROGENASE: LDH: 122 U/L (ref 98–192)

## 2019-06-09 SURGERY — Surgical Case
Anesthesia: Spinal | Wound class: Clean Contaminated

## 2019-06-09 MED ORDER — OXYTOCIN-SODIUM CHLORIDE 30-0.9 UT/500ML-% IV SOLN
INTRAVENOUS | Status: DC | PRN
  Administered 2019-06-09: 30 [IU] via INTRAVENOUS

## 2019-06-09 MED ORDER — PHENYLEPHRINE HCL-NACL 20-0.9 MG/250ML-% IV SOLN
INTRAVENOUS | Status: AC
  Filled 2019-06-09: qty 250

## 2019-06-09 MED ORDER — LACTATED RINGERS IV SOLN: 500.0000 mL | Freq: Once | INTRAVENOUS | Status: DC

## 2019-06-09 MED ORDER — LACTATED RINGERS IV SOLN: 500.0000 mL | INTRAVENOUS | Status: DC | PRN

## 2019-06-09 MED ORDER — ENOXAPARIN SODIUM 80 MG/0.8ML ~~LOC~~ SOLN
0.5000 mg/kg | SUBCUTANEOUS | Status: DC
  Administered 2019-06-10 – 2019-06-12 (×3): 65 mg via SUBCUTANEOUS
  Filled 2019-06-09 (×3): qty 0.65
  Filled 2019-06-09: qty 0.8
  Filled 2019-06-09: qty 0.65

## 2019-06-09 MED ORDER — FERROUS SULFATE 325 (65 FE) MG PO TABS
325.0000 mg | ORAL_TABLET | Freq: Two times a day (BID) | ORAL | Status: DC
  Administered 2019-06-09 – 2019-06-12 (×6): 325 mg via ORAL
  Filled 2019-06-09 (×6): qty 1

## 2019-06-09 MED ORDER — OXYCODONE HCL 5 MG/5ML PO SOLN: 5.0000 mg | Freq: Once | ORAL | Status: DC | PRN

## 2019-06-09 MED ORDER — IBUPROFEN 800 MG PO TABS
800.0000 mg | ORAL_TABLET | Freq: Three times a day (TID) | ORAL | Status: AC
  Administered 2019-06-09 – 2019-06-12 (×9): 800 mg via ORAL
  Filled 2019-06-09 (×9): qty 1

## 2019-06-09 MED ORDER — ACETAMINOPHEN 500 MG PO TABS
1000.0000 mg | ORAL_TABLET | Freq: Four times a day (QID) | ORAL | Status: DC
  Administered 2019-06-09 – 2019-06-12 (×13): 1000 mg via ORAL
  Filled 2019-06-09 (×13): qty 2

## 2019-06-09 MED ORDER — OXYCODONE-ACETAMINOPHEN 5-325 MG PO TABS: 1.0000 | ORAL_TABLET | ORAL | Status: DC | PRN

## 2019-06-09 MED ORDER — MENTHOL 3 MG MT LOZG: 1.0000 | LOZENGE | OROMUCOSAL | Status: DC | PRN

## 2019-06-09 MED ORDER — COCONUT OIL OIL: 1.0000 "application " | TOPICAL_OIL | Status: DC | PRN

## 2019-06-09 MED ORDER — SIMETHICONE 80 MG PO CHEW: 80.0000 mg | CHEWABLE_TABLET | ORAL | Status: DC | PRN

## 2019-06-09 MED ORDER — EPHEDRINE 5 MG/ML INJ: 10.0000 mg | INTRAVENOUS | Status: DC | PRN

## 2019-06-09 MED ORDER — NALBUPHINE HCL 10 MG/ML IJ SOLN: 5.0000 mg | INTRAMUSCULAR | Status: DC | PRN

## 2019-06-09 MED ORDER — OXYTOCIN-SODIUM CHLORIDE 30-0.9 UT/500ML-% IV SOLN: 2.5000 [IU]/h | INTRAVENOUS | Status: AC

## 2019-06-09 MED ORDER — PHENYLEPHRINE 40 MCG/ML (10ML) SYRINGE FOR IV PUSH (FOR BLOOD PRESSURE SUPPORT): 80.0000 ug | PREFILLED_SYRINGE | INTRAVENOUS | Status: DC | PRN

## 2019-06-09 MED ORDER — OXYCODONE-ACETAMINOPHEN 5-325 MG PO TABS: 2.0000 | ORAL_TABLET | ORAL | Status: DC | PRN

## 2019-06-09 MED ORDER — DEXTROSE 5 % IV SOLN
3.0000 g | INTRAVENOUS | Status: AC
  Administered 2019-06-09: 3 g via INTRAVENOUS
  Filled 2019-06-09: qty 3000

## 2019-06-09 MED ORDER — ONDANSETRON HCL 4 MG/2ML IJ SOLN
INTRAMUSCULAR | Status: AC
  Filled 2019-06-09: qty 2

## 2019-06-09 MED ORDER — STERILE WATER FOR IRRIGATION IR SOLN
Status: DC | PRN
  Administered 2019-06-09: 1000 mL

## 2019-06-09 MED ORDER — PRENATAL MULTIVITAMIN CH
1.0000 | ORAL_TABLET | Freq: Every day | ORAL | Status: DC
  Administered 2019-06-10 – 2019-06-12 (×3): 1 via ORAL
  Filled 2019-06-09 (×3): qty 1

## 2019-06-09 MED ORDER — LIDOCAINE HCL (PF) 1 % IJ SOLN: 30.0000 mL | INTRAMUSCULAR | Status: DC | PRN

## 2019-06-09 MED ORDER — DIPHENHYDRAMINE HCL 25 MG PO CAPS
25.0000 mg | ORAL_CAPSULE | ORAL | Status: DC | PRN
  Filled 2019-06-09: qty 1

## 2019-06-09 MED ORDER — OXYTOCIN-SODIUM CHLORIDE 30-0.9 UT/500ML-% IV SOLN
2.5000 [IU]/h | INTRAVENOUS | Status: DC
  Administered 2019-06-09: 2.5 [IU]/h via INTRAVENOUS
  Filled 2019-06-09: qty 500

## 2019-06-09 MED ORDER — SCOPOLAMINE 1 MG/3DAYS TD PT72: 1.0000 | MEDICATED_PATCH | Freq: Once | TRANSDERMAL | Status: DC

## 2019-06-09 MED ORDER — FENTANYL CITRATE (PF) 100 MCG/2ML IJ SOLN
INTRAMUSCULAR | Status: AC
  Filled 2019-06-09: qty 2

## 2019-06-09 MED ORDER — LIDOCAINE-EPINEPHRINE (PF) 2 %-1:200000 IJ SOLN
INTRAMUSCULAR | Status: AC
  Filled 2019-06-09: qty 10

## 2019-06-09 MED ORDER — SODIUM CHLORIDE 0.9 % IR SOLN
Status: DC | PRN
  Administered 2019-06-09: 1000 mL

## 2019-06-09 MED ORDER — DIPHENHYDRAMINE HCL 50 MG/ML IJ SOLN: 12.5000 mg | INTRAMUSCULAR | Status: DC | PRN

## 2019-06-09 MED ORDER — MISOPROSTOL 25 MCG QUARTER TABLET
25.0000 ug | ORAL_TABLET | ORAL | Status: DC | PRN
  Administered 2019-06-09: 25 ug via VAGINAL
  Filled 2019-06-09: qty 1

## 2019-06-09 MED ORDER — DIPHENHYDRAMINE HCL 25 MG PO CAPS
25.0000 mg | ORAL_CAPSULE | Freq: Four times a day (QID) | ORAL | Status: DC | PRN
  Administered 2019-06-10 (×2): 25 mg via ORAL
  Filled 2019-06-09: qty 1

## 2019-06-09 MED ORDER — LACTATED RINGERS IV SOLN: INTRAVENOUS | Status: DC

## 2019-06-09 MED ORDER — SODIUM CHLORIDE 0.9% FLUSH: 3.0000 mL | INTRAVENOUS | Status: DC | PRN

## 2019-06-09 MED ORDER — SERTRALINE HCL 50 MG PO TABS
50.0000 mg | ORAL_TABLET | Freq: Every day | ORAL | Status: DC
  Filled 2019-06-09 (×3): qty 1

## 2019-06-09 MED ORDER — OXYCODONE HCL 5 MG PO TABS
5.0000 mg | ORAL_TABLET | ORAL | Status: DC | PRN
  Administered 2019-06-10 – 2019-06-12 (×5): 10 mg via ORAL
  Filled 2019-06-09 (×5): qty 2
  Filled 2019-06-09: qty 1
  Filled 2019-06-09: qty 2

## 2019-06-09 MED ORDER — SENNOSIDES-DOCUSATE SODIUM 8.6-50 MG PO TABS
2.0000 | ORAL_TABLET | ORAL | Status: DC
  Administered 2019-06-09 – 2019-06-11 (×3): 2 via ORAL
  Filled 2019-06-09 (×3): qty 2

## 2019-06-09 MED ORDER — ACETAMINOPHEN 325 MG PO TABS: 650.0000 mg | ORAL_TABLET | ORAL | Status: DC | PRN

## 2019-06-09 MED ORDER — PHENYLEPHRINE HCL-NACL 20-0.9 MG/250ML-% IV SOLN
INTRAVENOUS | Status: DC | PRN
  Administered 2019-06-09: 60 ug/min via INTRAVENOUS

## 2019-06-09 MED ORDER — SODIUM CHLORIDE 0.9 % IV SOLN: INTRAVENOUS | Status: DC | PRN

## 2019-06-09 MED ORDER — KETOROLAC TROMETHAMINE 30 MG/ML IJ SOLN
INTRAMUSCULAR | Status: AC
  Filled 2019-06-09: qty 1

## 2019-06-09 MED ORDER — NALOXONE HCL 0.4 MG/ML IJ SOLN: 0.4000 mg | INTRAMUSCULAR | Status: DC | PRN

## 2019-06-09 MED ORDER — NALBUPHINE HCL 10 MG/ML IJ SOLN: 5.0000 mg | Freq: Once | INTRAMUSCULAR | Status: AC | PRN

## 2019-06-09 MED ORDER — FENTANYL-BUPIVACAINE-NACL 0.5-0.125-0.9 MG/250ML-% EP SOLN: 12.0000 mL/h | EPIDURAL | Status: DC | PRN

## 2019-06-09 MED ORDER — BUPIVACAINE IN DEXTROSE 0.75-8.25 % IT SOLN
INTRATHECAL | Status: DC | PRN
  Administered 2019-06-09: 1.6 mg via INTRATHECAL

## 2019-06-09 MED ORDER — MEPERIDINE HCL 25 MG/ML IJ SOLN: 6.2500 mg | INTRAMUSCULAR | Status: DC | PRN

## 2019-06-09 MED ORDER — OXYCODONE HCL 5 MG PO TABS: 5.0000 mg | ORAL_TABLET | Freq: Once | ORAL | Status: DC | PRN

## 2019-06-09 MED ORDER — DIBUCAINE (PERIANAL) 1 % EX OINT
1.0000 "application " | TOPICAL_OINTMENT | CUTANEOUS | Status: DC | PRN
  Administered 2019-06-12: 1 via RECTAL
  Filled 2019-06-09: qty 28

## 2019-06-09 MED ORDER — TERBUTALINE SULFATE 1 MG/ML IJ SOLN: 0.2500 mg | Freq: Once | INTRAMUSCULAR | Status: DC | PRN

## 2019-06-09 MED ORDER — WITCH HAZEL-GLYCERIN EX PADS
1.0000 "application " | MEDICATED_PAD | CUTANEOUS | Status: DC | PRN
  Administered 2019-06-12: 1 via TOPICAL

## 2019-06-09 MED ORDER — HYDROXYZINE HCL 50 MG PO TABS: 50.0000 mg | ORAL_TABLET | Freq: Four times a day (QID) | ORAL | Status: DC | PRN

## 2019-06-09 MED ORDER — HYDROMORPHONE HCL 1 MG/ML IJ SOLN: 0.2000 mg | INTRAMUSCULAR | Status: DC | PRN

## 2019-06-09 MED ORDER — ONDANSETRON HCL 4 MG/2ML IJ SOLN: 4.0000 mg | Freq: Three times a day (TID) | INTRAMUSCULAR | Status: DC | PRN

## 2019-06-09 MED ORDER — MORPHINE SULFATE (PF) 0.5 MG/ML IJ SOLN
INTRAMUSCULAR | Status: DC | PRN
  Administered 2019-06-09: .15 mg via INTRATHECAL

## 2019-06-09 MED ORDER — ONDANSETRON HCL 4 MG/2ML IJ SOLN: 4.0000 mg | Freq: Once | INTRAMUSCULAR | Status: DC | PRN

## 2019-06-09 MED ORDER — MORPHINE SULFATE (PF) 0.5 MG/ML IJ SOLN
INTRAMUSCULAR | Status: AC
  Filled 2019-06-09: qty 10

## 2019-06-09 MED ORDER — OXYTOCIN-SODIUM CHLORIDE 30-0.9 UT/500ML-% IV SOLN
INTRAVENOUS | Status: AC
  Filled 2019-06-09: qty 500

## 2019-06-09 MED ORDER — NALOXONE HCL 4 MG/10ML IJ SOLN
1.0000 ug/kg/h | INTRAVENOUS | Status: DC | PRN
  Filled 2019-06-09: qty 5

## 2019-06-09 MED ORDER — SOD CITRATE-CITRIC ACID 500-334 MG/5ML PO SOLN
30.0000 mL | ORAL | Status: DC | PRN
  Administered 2019-06-09: 30 mL via ORAL
  Filled 2019-06-09: qty 30

## 2019-06-09 MED ORDER — SOD CITRATE-CITRIC ACID 500-334 MG/5ML PO SOLN: 30.0000 mL | ORAL | Status: DC

## 2019-06-09 MED ORDER — LACTATED RINGERS IV SOLN: INTRAVENOUS | Status: DC | PRN

## 2019-06-09 MED ORDER — FENTANYL CITRATE (PF) 100 MCG/2ML IJ SOLN
INTRAMUSCULAR | Status: DC | PRN
  Administered 2019-06-09: 15 ug via INTRATHECAL

## 2019-06-09 MED ORDER — SIMETHICONE 80 MG PO CHEW
80.0000 mg | CHEWABLE_TABLET | Freq: Three times a day (TID) | ORAL | Status: DC
  Administered 2019-06-09 – 2019-06-12 (×9): 80 mg via ORAL
  Filled 2019-06-09 (×9): qty 1

## 2019-06-09 MED ORDER — FENTANYL CITRATE (PF) 100 MCG/2ML IJ SOLN: 25.0000 ug | INTRAMUSCULAR | Status: DC | PRN

## 2019-06-09 MED ORDER — SODIUM BICARBONATE 8.4 % IV SOLN
INTRAVENOUS | Status: AC
  Filled 2019-06-09: qty 50

## 2019-06-09 MED ORDER — ONDANSETRON HCL 4 MG/2ML IJ SOLN: 4.0000 mg | Freq: Four times a day (QID) | INTRAMUSCULAR | Status: DC | PRN

## 2019-06-09 MED ORDER — DEXTROSE 5 % IV SOLN
INTRAVENOUS | Status: AC
  Filled 2019-06-09: qty 3000

## 2019-06-09 MED ORDER — NALBUPHINE HCL 10 MG/ML IJ SOLN
5.0000 mg | Freq: Once | INTRAMUSCULAR | Status: AC | PRN
  Administered 2019-06-09: 5 mg via SUBCUTANEOUS

## 2019-06-09 MED ORDER — ONDANSETRON HCL 4 MG/2ML IJ SOLN
INTRAMUSCULAR | Status: DC | PRN
  Administered 2019-06-09: 4 mg via INTRAVENOUS

## 2019-06-09 MED ORDER — OXYTOCIN BOLUS FROM INFUSION: 500.0000 mL | Freq: Once | INTRAVENOUS | Status: DC

## 2019-06-09 MED ORDER — NALBUPHINE HCL 10 MG/ML IJ SOLN
INTRAMUSCULAR | Status: AC
  Filled 2019-06-09: qty 1

## 2019-06-09 MED ORDER — ZOLPIDEM TARTRATE 5 MG PO TABS: 5.0000 mg | ORAL_TABLET | Freq: Every evening | ORAL | Status: DC | PRN

## 2019-06-09 MED ORDER — FENTANYL CITRATE (PF) 100 MCG/2ML IJ SOLN: 50.0000 ug | INTRAMUSCULAR | Status: DC | PRN

## 2019-06-09 MED ORDER — SIMETHICONE 80 MG PO CHEW
80.0000 mg | CHEWABLE_TABLET | ORAL | Status: DC
  Administered 2019-06-09 – 2019-06-11 (×3): 80 mg via ORAL
  Filled 2019-06-09 (×3): qty 1

## 2019-06-09 MED ORDER — KETOROLAC TROMETHAMINE 30 MG/ML IJ SOLN
30.0000 mg | Freq: Once | INTRAMUSCULAR | Status: AC | PRN
  Administered 2019-06-09: 30 mg via INTRAVENOUS

## 2019-06-09 SURGICAL SUPPLY — 44 items
APL SKNCLS STERI-STRIP NONHPOA (GAUZE/BANDAGES/DRESSINGS) ×2
BARRIER ADHS 3X4 INTERCEED (GAUZE/BANDAGES/DRESSINGS) ×2 IMPLANT
BENZOIN TINCTURE PRP APPL 2/3 (GAUZE/BANDAGES/DRESSINGS) ×3 IMPLANT
BRR ADH 4X3 ABS CNTRL BYND (GAUZE/BANDAGES/DRESSINGS) ×2
CHLORAPREP W/TINT 26ML (MISCELLANEOUS) ×2 IMPLANT
CLAMP CORD UMBIL (MISCELLANEOUS) IMPLANT
CLOTH BEACON ORANGE TIMEOUT ST (SAFETY) ×2 IMPLANT
DRESSING PREVENA PLUS CUSTOM (GAUZE/BANDAGES/DRESSINGS) IMPLANT
DRSG OPSITE POSTOP 4X10 (GAUZE/BANDAGES/DRESSINGS) ×2 IMPLANT
DRSG PREVENA PLUS CUSTOM (GAUZE/BANDAGES/DRESSINGS) ×2
ELECT REM PT RETURN 9FT ADLT (ELECTROSURGICAL) ×2
ELECTRODE REM PT RTRN 9FT ADLT (ELECTROSURGICAL) ×1 IMPLANT
EXTRACTOR VACUUM KIWI (MISCELLANEOUS) IMPLANT
GLOVE BIOGEL M 7.0 STRL (GLOVE) ×4 IMPLANT
GLOVE BIOGEL PI IND STRL 7.0 (GLOVE) ×3 IMPLANT
GLOVE BIOGEL PI INDICATOR 7.0 (GLOVE) ×3
GOWN STRL REUS W/TWL LRG LVL3 (GOWN DISPOSABLE) ×6 IMPLANT
HOVERMATT SINGLE USE (MISCELLANEOUS) ×1 IMPLANT
KIT ABG SYR 3ML LUER SLIP (SYRINGE) IMPLANT
NDL HYPO 25X5/8 SAFETYGLIDE (NEEDLE) IMPLANT
NEEDLE HYPO 25X5/8 SAFETYGLIDE (NEEDLE) IMPLANT
NS IRRIG 1000ML POUR BTL (IV SOLUTION) ×2 IMPLANT
PACK C SECTION WH (CUSTOM PROCEDURE TRAY) ×2 IMPLANT
PAD OB MATERNITY 4.3X12.25 (PERSONAL CARE ITEMS) ×2 IMPLANT
PENCIL SMOKE EVAC W/HOLSTER (ELECTROSURGICAL) ×2 IMPLANT
RETAINER VISCERAL (MISCELLANEOUS) ×1 IMPLANT
RETRACTOR TRAXI PANNICULUS (MISCELLANEOUS) IMPLANT
RTRCTR C-SECT PINK 25CM LRG (MISCELLANEOUS) IMPLANT
SPONGE LAP 18X18 RF (DISPOSABLE) ×1 IMPLANT
STRIP CLOSURE SKIN 1/2X4 (GAUZE/BANDAGES/DRESSINGS) ×2 IMPLANT
SUT PDS AB 0 CT1 27 (SUTURE) ×4 IMPLANT
SUT PLAIN 0 NONE (SUTURE) IMPLANT
SUT PLAIN 2 0 XLH (SUTURE) ×1 IMPLANT
SUT VIC AB 0 CTX 36 (SUTURE) ×6
SUT VIC AB 0 CTX36XBRD ANBCTRL (SUTURE) ×3 IMPLANT
SUT VIC AB 2-0 CT1 27 (SUTURE) ×2
SUT VIC AB 2-0 CT1 TAPERPNT 27 (SUTURE) ×1 IMPLANT
SUT VIC AB 3-0 SH 27 (SUTURE)
SUT VIC AB 3-0 SH 27X BRD (SUTURE) IMPLANT
SUT VIC AB 4-0 KS 27 (SUTURE) ×2 IMPLANT
TOWEL OR 17X24 6PK STRL BLUE (TOWEL DISPOSABLE) ×2 IMPLANT
TRAXI PANNICULUS RETRACTOR (MISCELLANEOUS) ×1
TRAY FOLEY W/BAG SLVR 14FR LF (SET/KITS/TRAYS/PACK) ×2 IMPLANT
WATER STERILE IRR 1000ML POUR (IV SOLUTION) ×2 IMPLANT

## 2019-06-09 NOTE — Anesthesia Postprocedure Evaluation (Signed)
Anesthesia Post Note  Patient: Kathy Mason  Procedure(s) Performed: CESAREAN SECTION (N/A )     Patient location during evaluation: PACU Anesthesia Type: Spinal Level of consciousness: oriented and awake and alert Pain management: pain level controlled Vital Signs Assessment: post-procedure vital signs reviewed and stable Respiratory status: spontaneous breathing, respiratory function stable and nonlabored ventilation Cardiovascular status: blood pressure returned to baseline and stable Postop Assessment: no headache, no backache, no apparent nausea or vomiting and spinal receding Anesthetic complications: no    Last Vitals:  Vitals:   06/09/19 1130 06/09/19 1153  BP: 121/73 132/75  Pulse:  75  Resp: 18 15  Temp:  36.7 C  SpO2: 99% 100%    Last Pain:  Vitals:   06/09/19 1153  TempSrc: Oral  PainSc:    Pain Goal:                   [name]

## 2019-06-09 NOTE — Op Note (Signed)
Cesarean Section Procedure Note  Indications: non-reassuring fetal status  Pre-operative Diagnosis: 39 week 0 day pregnancy.  Post-operative Diagnosis: same  Surgeon: Gerald Leitz M.D.  Assistants: None  Anesthesia: Spinal anesthesia  ASA Class: 2   Procedure Details   The patient was seen in the Holding Room. The risks, benefits, complications, treatment options, and expected outcomes were discussed with the patient.  The patient concurred with the proposed plan, giving informed consent.  The site of surgery properly noted/marked. The patient was taken to Operating Room # A, identified as Kathy Mason and the procedure verified as C-Section Delivery. A Time Out was held and the above information confirmed.  After induction of anesthesia, the patient was draped and prepped in the usual sterile manner. A Pfannenstiel incision was made and carried down through the subcutaneous tissue to the fascia. Fascial incision was made and extended transversely. The fascia was separated from the underlying rectus tissue superiorly and inferiorly. The peritoneum was identified and entered. Peritoneal incision was extended longitudinally. The utero-vesical peritoneal reflection was incised transversely and the bladder flap was bluntly freed from the lower uterine segment. A low transverse uterine incision was made. Delivered from cephalic presentation was a  Female with Apgar scores of 9 at one minute and 9 at five minutes. After the umbilical cord was clamped and cut cord blood was obtained for evaluation. The placenta was removed intact and appeared normal. The uterine outline, tubes and ovaries appeared normal. The uterine incision was closed with running locked sutures of 0 vicryl . A second layer of 0 vicryl was used to imbricate the incision. . Hemostasis was observed. Lavage was carried out until clear. Interseed was placed along the uterine incision. The peritoneum was reapproximated with . 2-0 vicryl.  The fascia was then reapproximated with running sutures of 0 pds. . The skin was reapproximated with 4-0 vicryl.  Instrument, sponge, and needle counts were correct prior the abdominal closure and at the conclusion of the case.   Findings: Female infant in the cephalic presentation. Normal fallopian tubes and ovaries   Estimated Blood Loss:  200 mL         Drains: None         Total IV Fluids:   Per anesthesia ml         Specimens: Placenta sent to labor and delivery           Implants: None         Complications:  None; patient tolerated the procedure well.         Disposition: PACU - hemodynamically stable.         Condition: stable  Attending Attestation: I performed the procedure.

## 2019-06-09 NOTE — Progress Notes (Signed)
Patient was off fetal monitor from [phone] due to trying to get external fetal monitoring POD to work. POD didn't work, FHR [place] and TOCO were reapplied.

## 2019-06-09 NOTE — Lactation Note (Signed)
This note was copied from a baby's chart. Lactation Consultation Note  Patient Name: Kathy Mason [id number] Date: 06/09/2019 Reason for consult: Initial assessment;1st time breastfeeding;Term;Maternal endocrine disorder Type of Endocrine Disorder?: Diabetes P1, 13 hour term female infant. Mom's hx: GDM on rx Glyburide, HSV2- Valtrex, Hx depression and anxiety-Zoloft-L2 safe with breastfeeding. Per mom, she attended virtual BF class online with the [place]WIC office and she has Medela DEBP that her friend loaned her.  Per mom, she feels breastfeeding is going well, infant latched 1st time-10 mins, 2nd time-5 minutes, 3rd time-6 minutes and 4th time-5 minutes and at 2040 infant was given 5 mls of donor breast milk due to having low blood sugar. LC unable to assess infant feeding due to infant asleep in basinet and not cuing to breastfeed. LC discussed hand expression and mom taught back, mom easily expressed 22 mls of colostrum in bullets, LC notice mom has edema in both breast and they are flat as result of edema. LC discussed breast storage and collection, mom understand breast milk should only stay out at room temperature for 4 hours. Mom will offer infant EBM after latching infant at breast at next feeding, mom will supplement EBM according to infant's age/ hours of life within first 24 hours. Mom was given breast shells to wear in bra during the day and understand not to wear them at night to help alleviate edema and help with flat nipples. Mom was given hand pump to pre-pump breast prior to latching infant to help evert nipple shaft out more to help with latch. Mom knows to call RN or LC if she has any questions, concerns or need assistance with latching infant at breast. Mom made aware of O/P services, breastfeeding support groups, community resources, and our phone # for post-discharge questions.    Maternal Data Formula Feeding for Exclusion: No Has patient been taught Hand  Expression?: Yes Does the patient have breastfeeding experience prior to this delivery?: No  Feeding    LATCH Score                   Interventions Interventions: Breast feeding basics reviewed;Skin to skin;Breast massage;Breast compression;Hand express;Expressed milk;Pre-pump if needed;Shells;Hand pump  Lactation Tools Discussed/Used Tools: Shells Shell Type: Other (comment)(edema in breast.) WIC Program: Yes(Per mom , she has her friend's Medela DEBP) Pump Review: Setup, frequency, and cleaning;Milk Storage Initiated by:: Danelle Earthly, IBCLC Date initiated:: 06/09/19   Consult Status Consult Status: Follow-up Date: 06/10/19 Follow-up type: In-patient    Danelle Earthly 06/09/2019, 10:28 PM

## 2019-06-09 NOTE — Progress Notes (Signed)
MD Progress Note - Called to bedside for repetitive decelerations.    Kathy Mason is a 34 y.o. G1P0 at [redacted]w[redacted]d by LMP admitted for induction of labor due to Gestational diabetes.  Subjective: Patient feeling more regular contractions  Objective: BP (!) 148/73   Pulse 90   Temp 98.8 F (37.1 C) (Oral)   Resp 17   Ht 5\' 2"  (1.575 m)   Wt 130.3 kg   LMP 08/20/2018   BMI 52.55 kg/m  No intake/output data recorded.  Reviewed the Fetal heart tracing there were three episodes of fetal bradycardia nadir 90's with return to baseline of 150's after 3-4 minutes.  Currently the baseline fetal heart beat is 150's minimal to moderate variability acceleration with movement of the head vaginally. UC:   irregular, every 2-3 minutes SVE:   Dilation: Closed Effacement (%): Thick Station: Ballotable Exam by:: Dr. [email or web address]  Labs: Lab Results  Component Value Date   WBC 8.7 06/09/2019   HGB 12.0 06/09/2019   HCT 38.2 06/09/2019   MCV 73.7 (L) 06/09/2019   PLT 205 06/09/2019    Assessment / Plan: 34 year old G1P0 at 39 weeks 0 days admitted for Induction of labor due to gestational diabetes, also possible gestational hypertension   Fetal heart tracing is Category II, currently reassuring with acceleration with touching fetal head through vaginal mucosa.   Discussed at length with patient the likelihood of cesarean section if the fetal heart tracing shows signs of distress again.   She intends to get an epidural.     31 06/09/2019, 5:37 AM

## 2019-06-09 NOTE — Progress Notes (Signed)
Labor Progress Note  Kathy Mason is a 34 y.o. female presenting for Induction of labor at 39 wks and 0 days due to A2 DM. Pregnancy also complicated by HSV2 infection , anxiety and depression. Prenatal care provided by Dr. Gerald Leitz with [facility]  Subjective: Pt asleep in bed, comfortable. Called by RN for prolonged decel noted for 2.22mins, and occ variables, at times the strip appears to have a changing baseline from 170s-150s. RN instructed to hold Cytotec, and no pitocin, fluid bolus, reposition and monitor, also notified about elevated Bps 140/80s, labs to be drawn now. Upon admission pt denies HA< RUQ pain or vision changes.  Patient Active Problem List   Diagnosis Date Noted  . Gestational diabetes 06/09/2019  . Abnormal glucose tolerance test (GTT) during pregnancy, antepartum 04/08/2019  . Status post laparoscopic cholecystectomy Jan 2018 01/18/2016   Objective: BP (!) 149/81   Pulse 92   Temp 98.8 F (37.1 C) (Oral)   Resp 17   Ht 5\' 2"  (1.575 m)   Wt 130.3 kg   LMP 08/20/2018   BMI 52.55 kg/m  No intake/output data recorded. No intake/output data recorded. NST: FHR baseline 150 bpm, Variability: minimal to moderate, Accelerations:present, Decelerations:  Present 2.59min prolonged to nadir of 60s, and occ variables noted= Cat 2/Reactive CTX:  Unable to discern, toco not picking up cxt.  Uterus gravid, soft non tender, moderate to palpate with contractions.  SVE:  Dilation: Closed Effacement (%): 40 Station: Ballotable Exam by:: K.Cowher RN  Assessment:  Kathy Mason is a 34 y.o. female presenting for Induction of labor at 39 wks and 0 days due to A2 DM. Pregnancy also complicated by HSV2 infection , anxiety and depression. Prenatal care provided by Dr. 31 with [facility]. Discontinued cytotec for now, pt resting, Cat 2 strip reviewed. Dr [facility] called and updated on pt elevated BP and Cat 2 strip, will monitor.  Patient Active Problem List   Diagnosis Date  Noted  . Gestational diabetes 06/09/2019  . Abnormal glucose tolerance test (GTT) during pregnancy, antepartum 04/08/2019  . Status post laparoscopic cholecystectomy Jan 2018 01/18/2016   NICHD: Category 2  Category 2 with active intrauterine resuscitative measures fluid bolus, position change/hold cytotec  Membranes:  Intact, no s/s of infection  Induction:    Cytotec x1 dose at 0039  Pain management:               IV pain management: xPRN             Epidural placement:  PRN  Elevated BP: 149/81, asymptomatic, no h/o CHTN, does not meet criteria for GHTN yet.   GBS Negative  A1GDM: Pt ate lots of sugary foods, and denies taking her night dose of glyburide.  CBG (last 3)  No results for input(s): GLUCAP in the last 72 hours.   Plan: Continue labor plan Continuous monitoring Rest Ambulate Frequent position changes to facilitate fetal rotation and descent. RN reassess with cervical exam at 0600 or earlier if necessary Elevated BP: cbc. Cmp, uric acid, LDH, and Urine PCR pending.  GDMA2: Q4H CBG Cat 2 Strip: D/C cytotec, resting bolus, reposition , RN to call if strip does not resolve.  Continue pitocin per protocol Anticipate labor progression and vaginal delivery.   Md Pinn aware of plan and verbalized agreement.  If strip resolved will report off to Dr 03/17/2016 at 0700.    Richardson Dopp, NP-C, CNM, MSN 06/09/2019. 3:05 AM

## 2019-06-09 NOTE — Anesthesia Preprocedure Evaluation (Signed)
Anesthesia Evaluation  Patient identified by MRN, date of birth, ID band Patient awake    Reviewed: Allergy & Precautions, H&P , NPO status , Patient's Chart, lab work & pertinent test results  History of Anesthesia Complications Negative for: history of anesthetic complications  Airway Mallampati: II  TM Distance: >3 FB Neck ROM: full    Dental no notable dental hx.    Pulmonary neg pulmonary ROS,    Pulmonary exam normal        Cardiovascular negative cardio ROS Normal cardiovascular exam     Neuro/Psych negative neurological ROS  negative psych ROS   GI/Hepatic negative GI ROS, Neg liver ROS,   Endo/Other  diabetes, Gestational, Oral Hypoglycemic AgentsMorbid obesityPCOS  Renal/GU negative Renal ROS  negative genitourinary   Musculoskeletal   Abdominal   Peds  Hematology negative hematology ROS (+)   Anesthesia Other Findings   Reproductive/Obstetrics (+) Pregnancy                            Anesthesia Physical Anesthesia Plan  ASA: III  Anesthesia Plan: Spinal   Post-op Pain Management:    Induction:   PONV Risk Score and Plan: Ondansetron and Treatment may vary due to age or medical condition  Airway Management Planned:   Additional Equipment:   Intra-op Plan:   Post-operative Plan:   Informed Consent: I have reviewed the patients History and Physical, chart, labs and discussed the procedure including the risks, benefits and alternatives for the proposed anesthesia with the patient or authorized representative who has indicated his/her understanding and acceptance.       Plan Discussed with:   Anesthesia Plan Comments:        Anesthesia Quick Evaluation

## 2019-06-09 NOTE — Transfer of Care (Signed)
Immediate Anesthesia Transfer of Care Note  Patient: Kathy Mason  Procedure(s) Performed: CESAREAN SECTION (N/A )  Patient Location: PACU  Anesthesia Type:Spinal  Level of Consciousness: awake  Airway & Oxygen Therapy: Patient Spontanous Breathing  Post-op Assessment: Report given to RN and Post -op Vital signs reviewed and stable  Post vital signs: Reviewed and stable  Last Vitals:  Vitals Value Taken Time  BP 121/78 06/09/19 1018  Temp    Pulse 84 06/09/19 1022  Resp 18 06/09/19 1022  SpO2 96 % 06/09/19 1022  Vitals shown include unvalidated device data.  Last Pain:  Vitals:   06/09/19 0720  TempSrc:   PainSc: 5          Complications: No apparent anesthesia complications

## 2019-06-09 NOTE — Anesthesia Procedure Notes (Signed)
Spinal  Patient location during procedure: OR Staffing Performed: anesthesiologist  Anesthesiologist: Britley Gashi E, MD Preanesthetic Checklist Completed: patient identified, IV checked, risks and benefits discussed, surgical consent, monitors and equipment checked, pre-op evaluation and timeout performed Spinal Block Patient position: sitting Prep: DuraPrep and site prepped and draped Patient monitoring: continuous pulse ox, blood pressure and heart rate Approach: midline Location: L3-4 Injection technique: single-shot Needle Needle type: Pencan  Needle gauge: 24 G Needle length: 9 cm Additional Notes Functioning IV was confirmed and monitors were applied. Sterile prep and drape, including hand hygiene and sterile gloves were used. The patient was positioned and the spine was prepped. The skin was anesthetized with lidocaine.  Free flow of clear CSF was obtained prior to injecting local anesthetic into the CSF. The needle was carefully withdrawn. The patient tolerated the procedure well.      

## 2019-06-09 NOTE — Progress Notes (Signed)
[name] was referred for history of depression/anxiety. * Referral screened out by Clinical Social Worker because none of the following criteria appear to apply: ~ History of anxiety/depression during this pregnancy, or of post-partum depression following prior delivery. ~ Diagnosis of anxiety and/or depression within last 3 years OR * [name]'s symptoms currently being treated with medication and/or therapy. Per further chart review, [name] on Zoloft for anxiety/depression.    Please contact the Clinical Social Worker if needs arise, by [name] request, or if [name] scores greater than 9/yes to question 10 on Edinburgh Postpartum Depression Screen.    Kathy Mason S. Kathy Mason, MSW, LCSW [facility] [phone]  

## 2019-06-09 NOTE — Progress Notes (Signed)
Kathy Mason is a 34 y.o. G1P0 at [redacted]w[redacted]d by LMP admitted for induction of labor due to Gestational diabetes.  Subjective: Patient denies current contractions. +FM .Marland Kitchen no lof no vaginal bleeding no ha/ visual disturbances or ruq pain.Marland Kitchen over night and this am fhr with variable and late decelerations after cytotec.   Objective: BP 140/80   Pulse 88   Temp (P) 98.4 F (36.9 C) (Oral)   Resp 16   Ht 5\' 2"  (1.575 m)   Wt 130.3 kg   LMP 08/20/2018   SpO2 100%   BMI 52.55 kg/m  No intake/output data recorded. No intake/output data recorded.  FHT:  FHR: 140 bpm, variability: moderate,  accelerations:  Present,  decelerations:  Present variable currently but previously late  UC:   none SVE:   Dilation: Closed Effacement (%): Thick Station: Ballotable Exam by:: Dr. [email or web address]  Labs: Lab Results  Component Value Date   WBC 8.7 06/09/2019   HGB 12.0 06/09/2019   HCT 38.2 06/09/2019   MCV 73.7 (L) 06/09/2019   PLT 205 06/09/2019    Assessment / Plan: 39 wks and 0 days with fetal intolerance of labor/ nonreassuring fetal heart rate . discussed with patient option of starting pitocin and if further signs of distress proceeding with cesarean section vs proceeding with cesarean section now. Initially pt opted to try pitocin and postpone csection until another episode of fetal intolerance of labor  but after discussion with her husband she has opted to proceed with cesarean section now. d/w pt r/ba of cesarean section including but not limited to infection bleeding damage to bowel bladder baby with the need for further surgery. r/o transfusion disccussed. she voiced understanding and desires to proceed with cesarean section due to nonreasuring fetal heart rate.     08/09/2019 06/09/2019, 8:11 AM

## 2019-06-09 NOTE — Progress Notes (Signed)
Pt and FOB have decided to go forward with C/S.  Dr Richardson Dopp at bedside for consent.

## 2019-06-10 ENCOUNTER — Encounter: Payer: Self-pay | Admitting: *Deleted

## 2019-06-10 LAB — CBC
HCT: 33.9 % — ABNORMAL LOW (ref 36.0–46.0)
Hemoglobin: 10.5 g/dL — ABNORMAL LOW (ref 12.0–15.0)
MCH: 23.1 pg — ABNORMAL LOW (ref 26.0–34.0)
MCHC: 31 g/dL (ref 30.0–36.0)
MCV: 74.7 fL — ABNORMAL LOW (ref 80.0–100.0)
Platelets: 181 10*3/uL (ref 150–400)
RBC: 4.54 MIL/uL (ref 3.87–5.11)
RDW: 15.9 % — ABNORMAL HIGH (ref 11.5–15.5)
WBC: 9.5 10*3/uL (ref 4.0–10.5)
nRBC: 0 % (ref 0.0–0.2)

## 2019-06-10 LAB — BIRTH TISSUE RECOVERY COLLECTION (PLACENTA DONATION)

## 2019-06-10 LAB — GLUCOSE, CAPILLARY: Glucose-Capillary: 88 mg/dL (ref 70–99)

## 2019-06-10 NOTE — Progress Notes (Signed)
Subjective: Postpartum Day 1: Cesarean Delivery Patient reports incisional pain, tolerating PO, + flatus and no problems voiding.    Objective: Vital signs in last 24 hours: Temp:  [98.1 F (36.7 C)-98.7 F (37.1 C)] 98.1 F (36.7 C) (06/03 0538) Pulse Rate:  [74-96] 88 (06/03 0538) Resp:  [17-18] 18 (06/03 0538) BP: (109-137)/(62-78) 136/78 (06/03 0538) SpO2:  [96 %-100 %] 96 % (06/03 0236)  Physical Exam:  General: alert, cooperative and no distress Lochia: appropriate Uterine Fundus: firm Incision: prevena in place  DVT Evaluation: No evidence of DVT seen on physical exam.  Recent Labs    06/09/19 0013 06/10/19 0350  HGB 12.0 10.5*  HCT 38.2 33.9*    Assessment/Plan: Status post Cesarean section. Doing well postoperatively.  Continue current care  Encouraged ambulation.  Gestational hypertension - bp well controlled post delivery without medication.  Baby in NICU due to hypoglycemia  Plan discharge on post op day 3..  1 wk PPV to rule out PPD/ BP Check / Remove Prevena .. pt has anxiety and should continue zoloft.  Dr. Dion Body to cover 06/11/2019.  Kathy Mason 06/10/2019, 6:03 PM

## 2019-06-10 NOTE — Lactation Note (Signed)
This note was copied from a baby's chart. Lactation Consultation Note  Patient Name: Kathy Mason [id number] Date: 06/10/2019 Reason for consult: Follow-up assessment;Primapara;1st time breastfeeding;NICU baby;Term;Maternal endocrine disorder Type of Endocrine Disorder?: Diabetes  LC in to see P1 Mom of term baby at 77 hrs old in the NICU for low blood sugars.  Baby has a gavage tube in place as Mom has requested no bottles.  Baby is being gavage fed formula and blood sugars are improving (41,59,65) .  Mom has been latching baby to the breast at most feedings, baby stays on for 5-10 mins.  Mom states she uses the football hold and RN gave baby an 8 latch score at last feeding at noon.  Mom was too tired to feed baby at last feeding, so baby received gavage only.    Mom sitting at side of bed and just starting to pump.  NICU RN set up DEBP for Mom today at 12 noon, when Mom transferred to [facility] Care Room.  Until then, she was using a hand pump.  Baby was being syringe fed EBM throughout night.   Mom expressed 2.5 ml at this pumping.   Encouraged STS and feeding baby often with cues.  Encouraged Mom to ask for help with latching as Mom has large, heavy breasts.  Explained importance of a deep latch to breast, feeling a tug not a pinching feeling, Mom nodding.   Mom to ask her RN to call lactation to observe a breastfeeding.  Until them, encouraged Mom to post feed double pump on initiation setting.    Mom got a call from [facility] today.   Interventions Interventions: Breast feeding basics reviewed;Skin to skin;Breast massage;Hand express;DEBP  Lactation Tools Discussed/Used Tools: Pump Breast pump type: Double-Electric Breast Pump Pump Review: Setup, frequency, and cleaning;Milk Storage Initiated by:: NICU RN Date initiated:: 06/10/19(at 27 hrs postpartum)   Consult Status Consult Status: Follow-up Date: 06/11/19 Follow-up type: In-patient    Judee Clara 06/10/2019, 4:47  PM

## 2019-06-10 NOTE — Discharge Instructions (Signed)
Cesarean Delivery, Care After This sheet gives you information about how to care for yourself after your procedure. Your health care provider may also give you more specific instructions. If you have problems or questions, contact your health care provider. What can I expect after the procedure? After the procedure, it is common to have:  A small amount of blood or clear fluid coming from the incision.  Some redness, swelling, and pain in your incision area.  Some abdominal pain and soreness.  Vaginal bleeding (lochia). Even though you did not have a vaginal delivery, you will still have vaginal bleeding and discharge.  Pelvic cramps.  Fatigue. You may have pain, swelling, and discomfort in the tissue between your vagina and your anus (perineum) if:  Your C-section was unplanned, and you were allowed to labor and push.  An incision was made in the area (episiotomy) or the tissue tore during attempted vaginal delivery. Follow these instructions at home: Incision care   Follow instructions from your health care provider about how to take care of your incision. Make sure you: ? Wash your hands with soap and water before you change your bandage (dressing). If soap and water are not available, use hand sanitizer. ? If you have a dressing, change it or remove it as told by your health care provider. ? Leave stitches (sutures), skin staples, skin glue, or adhesive strips in place. These skin closures may need to stay in place for 2 weeks or longer. If adhesive strip edges start to loosen and curl up, you may trim the loose edges. Do not remove adhesive strips completely unless your health care provider tells you to do that.  Check your incision area every day for signs of infection. Check for: ? More redness, swelling, or pain. ? More fluid or blood. ? Warmth. ? Pus or a bad smell.  Do not take baths, swim, or use a hot tub until your health care provider says it's okay. Ask your health  care provider if you can take showers.  When you cough or sneeze, hug a pillow. This helps with pain and decreases the chance of your incision opening up (dehiscing). Do this until your incision heals. Medicines  Take over-the-counter and prescription medicines only as told by your health care provider.  If you were prescribed an antibiotic medicine, take it as told by your health care provider. Do not stop taking the antibiotic even if you start to feel better.  Do not drive or use heavy machinery while taking prescription pain medicine. Lifestyle  Do not drink alcohol. This is especially important if you are breastfeeding or taking pain medicine.  Do not use any products that contain nicotine or tobacco, such as cigarettes, e-cigarettes, and chewing tobacco. If you need help quitting, ask your health care provider. Eating and drinking  Drink at least 8 eight-ounce glasses of water every day unless told not to by your health care provider. If you breastfeed, you may need to drink even more water.  Eat high-fiber foods every day. These foods may help prevent or relieve constipation. High-fiber foods include: ? Whole grain cereals and breads. ? Brown rice. ? Beans. ? Fresh fruits and vegetables. Activity   If possible, have someone help you care for your baby and help with household activities for at least a few days after you leave the hospital.  Return to your normal activities as told by your health care provider. Ask your health care provider what activities are safe for   you.  Rest as much as possible. Try to rest or take a nap while your baby is sleeping.  Do not lift anything that is heavier than 10 lbs (4.5 kg), or the limit that you were told, until your health care provider says that it is safe.  Talk with your health care provider about when you can engage in sexual activity. This may depend on your: ? Risk of infection. ? How fast you heal. ? Comfort and desire to  engage in sexual activity. General instructions  Do not use tampons or douches until your health care provider approves.  Wear loose, comfortable clothing and a supportive and well-fitting bra.  Keep your perineum clean and dry. Wipe from front to back when you use the toilet.  If you pass a blood clot, save it and call your health care provider to discuss. Do not flush blood clots down the toilet before you get instructions from your health care provider.  Keep all follow-up visits for you and your baby as told by your health care provider. This is important. Contact a health care provider if:  You have: ? A fever. ? Bad-smelling vaginal discharge. ? Pus or a bad smell coming from your incision. ? Difficulty or pain when urinating. ? A sudden increase or decrease in the frequency of your bowel movements. ? More redness, swelling, or pain around your incision. ? More fluid or blood coming from your incision. ? A rash. ? Nausea. ? Little or no interest in activities you used to enjoy. ? Questions about caring for yourself or your baby.  Your incision feels warm to the touch.  Your breasts turn red or become painful or hard.  You feel unusually sad or worried.  You vomit.  You pass a blood clot from your vagina.  You urinate more than usual.  You are dizzy or light-headed. Get help right away if:  You have: ? Pain that does not go away or get better with medicine. ? Chest pain. ? Difficulty breathing. ? Blurred vision or spots in your vision. ? Thoughts about hurting yourself or your baby. ? New pain in your abdomen or in one of your legs. ? A severe headache.  You faint.  You bleed from your vagina so much that you fill more than one sanitary pad in one hour. Bleeding should not be heavier than your heaviest period. Summary  After the procedure, it is common to have pain at your incision site, abdominal cramping, and slight bleeding from your vagina.  Check  your incision area every day for signs of infection.  Tell your health care provider about any unusual symptoms.  Keep all follow-up visits for you and your baby as told by your health care provider. This information is not intended to replace advice given to you by your health care provider. Make sure you discuss any questions you have with your health care provider. Document Revised: 07/02/2017 Document Reviewed: 07/02/2017 Elsevier Patient Education  2020 [employer]  

## 2019-06-11 MED ORDER — LABETALOL HCL 5 MG/ML IV SOLN: 20.0000 mg | INTRAVENOUS | Status: DC | PRN

## 2019-06-11 MED ORDER — LABETALOL HCL 5 MG/ML IV SOLN: 40.0000 mg | INTRAVENOUS | Status: DC | PRN

## 2019-06-11 MED ORDER — HYDRALAZINE HCL 20 MG/ML IJ SOLN: 5.0000 mg | INTRAMUSCULAR | Status: DC | PRN

## 2019-06-11 MED ORDER — NIFEDIPINE ER OSMOTIC RELEASE 30 MG PO TB24
30.0000 mg | ORAL_TABLET | Freq: Every day | ORAL | Status: DC
  Administered 2019-06-11 – 2019-06-12 (×2): 30 mg via ORAL
  Filled 2019-06-11 (×2): qty 1

## 2019-06-11 MED ORDER — HYDRALAZINE HCL 20 MG/ML IJ SOLN: 10.0000 mg | INTRAMUSCULAR | Status: DC | PRN

## 2019-06-11 NOTE — Lactation Note (Signed)
This note was copied from a baby's chart. Lactation Consultation Note  Patient Name: Kathy Mason MVEHM'C Date: 06/11/2019 Reason for consult: Follow-up assessment;Mother's request  1515 - [phone] - RN requested follow up to assist with latching baby [place] to the left breast. I assisted with latching baby in football hold to the left side. Ms. Scarpati has large pendulous breasts, and she has difficulty seeing the baby's latch. I reviewed how to place a nipple shield, and I encouraged her to practice between feedings.  Initially [place] did not latch. I then allowed her to suckle on a gloved finger, and we were then able to transfer her to the breast. Ms. Plunk stated that she felt "strong" tugs. Baby appeared to have rhythmic suckling sequences and swallows.  I praised Ms. Blakesley for doing a good job. I will try to follow up with her tomorrow as discharge is pending to discuss plan.  Feeding Feeding Type: Breast Milk  LATCH Score Latch: Grasps breast easily, tongue down, lips flanged, rhythmical sucking.  Audible Swallowing: Spontaneous and intermittent  Type of Nipple: Everted at rest and after stimulation  Comfort (Breast/Nipple): Soft / non-tender  Hold (Positioning): Assistance needed to correctly position infant at breast and maintain latch.  LATCH Score: 9  Interventions Interventions: Breast feeding basics reviewed;Assisted with latch;Shells;Support pillows;Adjust position  Lactation Tools Discussed/Used Tools: Nipple Shields Nipple shield size: 24   Consult Status Consult Status: Follow-up Date: 06/12/19 Follow-up type: In-patient    Walker Shadow 06/11/2019, 5:50 PM

## 2019-06-11 NOTE — Progress Notes (Signed)
Subjective: Postpartum Day 2: Cesarean Delivery Patient reports incisional pain, tolerating PO, + flatus and no problems voiding.  Pt reports gas pain yesterday but she has passed a lot today and feels better.  Objective: Vital signs in last 24 hours: Temp:  [98.6 F (37 C)-99.6 F (37.6 C)] 99.6 F (37.6 C) (06/04 1500) Pulse Rate:  [96-102] 100 (06/04 1552) Resp:  [18] 18 (06/04 1500) BP: (132-150)/(76-90) 143/80 (06/04 1552) SpO2:  [99 %-100 %] 99 % (06/04 0503)  Physical Exam:  General: alert, cooperative and no distress Lochia: appropriate Uterine Fundus: firm Incision: prevena in place  DVT Evaluation: No evidence of DVT seen on physical exam.  CBG (last 3)  Recent Labs    06/09/19 0446 06/09/19 1041 06/10/19 0550  GLUCAP 78 94 88     CBC Latest Ref Rng & Units 06/10/2019 06/09/2019 10/12/2015  WBC 4.0 - 10.5 K/uL 9.5 8.7 11.3(H)  Hemoglobin 12.0 - 15.0 g/dL 10.5(L) 12.0 12.3  Hematocrit 36.0 - 46.0 % 33.9(L) 38.2 39.7  Platelets 150 - 400 K/uL 181 205 261   Assessment/Plan: Status post Cesarean section. Doing well postoperatively.  Continue current care  Encouraged ambulation.  Gestational hypertension - bp well mildly elevated.  Due to several risk factors, will start Procardia XL 30 mg once daily. Morbid obesity-Prevena wound vac in place and functioning properly.  Continue Lovenox.  Baby in NICU due to hypoglycemia  Plan discharge on post op day 3..  1 wk PPV to rule out PPD/ BP Check / Remove Prevena .. pt has anxiety and should continue zoloft.  Pt informed.  Geryl Rankins 06/11/2019, 7:12 PM

## 2019-06-11 NOTE — Lactation Note (Signed)
This note was copied from a baby's chart. Lactation Consultation Note  Patient Name: Kathy Mason ICHTV'G Date: 06/11/2019 Reason for consult: Follow-up assessment;Term;NICU baby Type of Endocrine Disorder?: Diabetes P1, 45 hour term female in NICU. LC entered Couplet Care Room mom was doing STS with infant. LC notice edema in breast is  starting to resolve.  Infant was not interested in latching to breast at this time. Mom will work on latching infant to breast again later today. Per mom,  other LC faxed for [facility] loaner DEBP Infant is currently being gavage tube feed a combination of formula and EBM. Per mom, she pumped earlier last night, LC assisted mom with pumping and mom pumped 27 mls EBM that will be given to infant at her next feeding. Mom knows to call [facility] services if she has questions, concerns or need assistance with latching infant at breast.  Maternal Data    Feeding Feeding Type: Formula  LATCH Score                   Interventions Interventions: Skin to skin;DEBP  Lactation Tools Discussed/Used     Consult Status Consult Status: Follow-up Date: 06/11/19 Follow-up type: In-patient    Danelle Earthly 06/11/2019, 6:24 AM

## 2019-06-11 NOTE — Lactation Note (Signed)
This note was copied from a baby's chart. Lactation Consultation Note  Patient Name: Kathy Mason MHWKG'S Date: 06/11/2019 Reason for consult: Follow-up assessment;Mother's request;1st time breastfeeding;NICU baby;Term  1345 - 1413 - I followed up with [name]. Baby Kathy Mason is now ad lib feeding, and she requested assistance with latch. Baby cueing upon entry. I encouraged [name] to latch baby in football hold on the right breast. She hand expressed transitional milk first.  Baby rooting but unable to latch at first. I noted some areolar edema; [name] states that her milk volume has increased today. She pumped 45 mls this am.  I then placed a size 24 nipple shield on the breast. Baby latched with rhythmic suckling sequences and spontaneous swallows. Baby fed approximately 12 minutes and then released the nipple. Milk in the shield. Baby burped afterward.  RN had prepared bottle on hand. I asked her to help with demonstration of side-lying hold and bottle feeding baby. For future feedings, encouraged [name] to feed on both breasts. [name] to pump after.  She is using a size 27 flange to pump, and she states this is comfortable. Her nipples are large; and she may be a candidate for a size 30.\  [name] expressed enthusiasm to see baby latch well. I praised her for her good effort.  Maternal Data Does the patient have breastfeeding experience prior to this delivery?: No  Feeding Feeding Type: Breast Fed  LATCH Score Latch: Grasps breast easily, tongue down, lips flanged, rhythmical sucking.  Audible Swallowing: Spontaneous and intermittent  Type of Nipple: Everted at rest and after stimulation  Comfort (Breast/Nipple): Soft / non-tender  Hold (Positioning): Assistance needed to correctly position infant at breast and maintain latch.  LATCH Score: 9  Interventions Interventions: Breast feeding basics reviewed;Assisted with latch;Hand express;Adjust  position;Support pillows  Lactation Tools Discussed/Used Tools: Nipple Shields Nipple shield size: 24 WIC Program: Yes Pump Review: Setup, frequency, and cleaning   Consult Status Consult Status: Follow-up Date: 06/12/19 Follow-up type: In-patient    Walker Shadow 06/11/2019, 2:12 PM

## 2019-06-12 MED ORDER — SERTRALINE HCL 50 MG PO TABS: 50.0000 mg | ORAL_TABLET | Freq: Every day | ORAL | 0 refills | Status: AC

## 2019-06-12 MED ORDER — SIMETHICONE 80 MG PO CHEW: 80.0000 mg | CHEWABLE_TABLET | ORAL | 0 refills | Status: DC | PRN

## 2019-06-12 MED ORDER — FERROUS SULFATE 325 (65 FE) MG PO TABS: 325.0000 mg | ORAL_TABLET | Freq: Two times a day (BID) | ORAL | 3 refills | Status: AC

## 2019-06-12 MED ORDER — OXYCODONE HCL 5 MG PO TABS: 5.0000 mg | ORAL_TABLET | Freq: Four times a day (QID) | ORAL | 0 refills | Status: DC | PRN

## 2019-06-12 MED ORDER — NIFEDIPINE ER 30 MG PO TB24: 30.0000 mg | ORAL_TABLET | Freq: Every day | ORAL | 0 refills | Status: AC

## 2019-06-12 NOTE — Lactation Note (Signed)
This note was copied from a baby's chart. Lactation Consultation Note  Patient Name: [name] HLKTG'Y Date: 06/12/2019 Reason for consult: Follow-up assessment;NICU baby;1st time breastfeeding;Primapara  1145 - 1215 - I followed up with Ms. Kathy Mason. Her milk volume has increased overnight, and her breasts are much fuller today. She also reports leaking. I palpated breasts and they are full and heavy but WNL and not engorged at this time. Recommended ice packs for 15 minutes several times day, and RN will help provide.  Baby Kathy Mason was due to eat. I assisted with positioning in the football hold on the left breast. Ms. Kathy Mason is becoming more proficient with placing her size 24 nipple shield. We rolled up a towel to help lift the breast due to fullness. Baby better able to access it with this change.   Baby Kathy Mason latched and fed for approximately 10 minutes and then choked and released the breast. Suckling sequences noted with good swallows and occasional gulps. Milk in shield after feeding.  I spent some time educated on breast care, engorgement management and milk storage guidelines. I recommended that as long as baby is doing well with breast feeding, we may start cutting back on some of the post-pumping. MS. Janish is aware of possibility of oversupply due to pumping. I suggested we touch base again tomorrow to finalize a plan for discharge.  MS. Kathy Mason heard from [facility] yesterday. She will either use a friend's pump when she goes home or take a [facility] loaner pump. She is aware of the loaner option until she is able to go to [facility].  LC to follow up again tomorrow to finalize discharge plan (pumping and at home pump). Recommended an OP appointment upon discharge to follow up due to nipple shield use.  Maternal Data Does the patient have breastfeeding experience prior to this delivery?: No  Feeding Feeding Type: Breast Fed  LATCH Score Latch: Grasps breast easily, tongue down, lips  flanged, rhythmical sucking.  Audible Swallowing: Spontaneous and intermittent  Type of Nipple: Everted at rest and after stimulation  Comfort (Breast/Nipple): Soft / non-tender  Hold (Positioning): Assistance needed to correctly position infant at breast and maintain latch.  LATCH Score: 9  Interventions Interventions: Breast feeding basics reviewed;Assisted with latch;Skin to skin;Hand express;Adjust position;Support pillows;Shells  Lactation Tools Discussed/Used Tools: Nipple Shields Nipple shield size: 24 WIC Program: Yes Pump Review: Setup, frequency, and cleaning   Consult Status Consult Status: Follow-up Date: 06/13/19 Follow-up type: In-patient    Kathy Mason 06/12/2019, 12:21 PM

## 2019-06-12 NOTE — Progress Notes (Signed)
Mother has refused zoloft every day

## 2019-06-12 NOTE — Discharge Summary (Signed)
OB Discharge Summary    Patient Name: Kathy Mason DOB: 05/16/1985 MRN: [id number]  Date of admission: 06/09/2019 Delivering MD: Christophe Louis  Date of delivery: 06/09/2019 Type of delivery: Primary LTCS  Newborn Data: Sex: Baby Girl  Name: [place] Live born female  Birth Weight: 6 lb 5.6 oz (2880 g) APGAR: 47, 9  Newborn Delivery   Birth date/time: 06/09/2019 09:06:00 Delivery type: C-Section, Low Transverse Trial of labor: Yes C-section categorization: Primary     Feeding: breast and bottle Infant being discharge to home with mother in stable condition.   Admitting diagnosis: Gestational diabetes [O24.419] Intrauterine pregnancy: [redacted]w[redacted]d     Secondary diagnosis:  Active Problems:   Gestational diabetes                                Complications: None                                                              Intrapartum Procedures: cesarean: low cervical, transverse Postpartum Procedures: none Complications-Operative and Postpartum: none Augmentation: Cytotec   History of Present Illness: [name] is a 34 y.o. female, G1P1001, who presents at [redacted]w[redacted]d weeks gestation. The patient has been followed at  [facility] Gynecology  Her pregnancy has been complicated by:  Patient Active Problem List   Diagnosis Date Noted  . Gestational diabetes 06/09/2019  . Abnormal glucose tolerance test (GTT) during pregnancy, antepartum 04/08/2019  . Status post laparoscopic cholecystectomy Jan 2018 01/18/2016    Hospital course:  Induction of Labor With Cesarean Section   34 y.o. yo G1P1001 at [redacted]w[redacted]d was admitted to the hospital 06/09/2019 for induction of labor. Patient had a labor course significant for non-reassuring FHR. The patient went for cesarean section due to Non-Reassuring FHR. Delivery details are as follows: Membrane Rupture Time/Date: 9:06 AM ,06/09/2019   Delivery Method:C-Section, Low Transverse  Details of operation can be found in separate operative  Note.  Patient had an uncomplicated postpartum course. She is ambulating, tolerating a regular diet, passing flatus, and urinating well.  Patient is discharged home in stable condition on 06/12/19.      Newborn Data: Birth date:06/09/2019  Birth time:9:06 AM  Gender:Female  Living status:Living  Apgars:9 ,9  Weight:2880 g                                Hospital Course--Unscheduled Cesarean:  Admitted 06/09/2019. Negative GBS.  Due to non-reassuring FHR, she was consented for cesarean, with Dr. Landry Mellow performing a primary LTCS under spinal anesthesia, with delivery of a viable female, with weight and Apgars as listed below. Infant was in good condition and remained at the patient's bedside.  The patient was taken to recovery in good condition.  Patient planned to breast feed.  On post-op day 1, patient was doing well, tolerating a regular diet, with Hgb of 10.5.  Throughout her stay, her physical exam was WNL, her incision was CDI. Onpost-op day #2 she was started on Procardia 30mg  XL for elevated blood pressures. She had negative PIH labs. By post-op day 3, she was up ad lib, tolerating a regular diet, with good pain control with  po med.  She was deemed to have received the full benefit of her hospital stay, and was discharged home in stable condition.  Contraceptive choice was condoms.    Physical exam  Vitals:   06/11/19 2332 06/12/19 0350 06/12/19 0709 06/12/19 1033  BP: (!) 147/76 130/79 134/75 130/75  Pulse: (!) 106 93 88 92  Resp: 19 18    Temp: 98.3 F (36.8 C) 97.9 F (36.6 C)    TempSrc: Oral Oral    SpO2: 100% 100%    Weight:      Height:       General: alert, cooperative and no distress Lochia: appropriate Uterine Fundus: firm Incision: Dressing is clean, dry, and intact, honeycomb dressing CDI Perineum: Intact DVT Evaluation: No evidence of DVT seen on physical exam.  Labs: Lab Results  Component Value Date   WBC 9.5 06/10/2019   HGB 10.5 (L) 06/10/2019   HCT 33.9 (L)  06/10/2019   MCV 74.7 (L) 06/10/2019   PLT 181 06/10/2019   CMP Latest Ref Rng & Units 06/09/2019  Glucose 70 - 99 mg/dL 99  BUN 6 - 20 mg/dL 14  Creatinine 6.27 - 0.35 mg/dL 0.09  Sodium 381 - 829 mmol/L 136  Potassium 3.5 - 5.1 mmol/L 4.3  Chloride 98 - 111 mmol/L 107  CO2 22 - 32 mmol/L 20(L)  Calcium 8.9 - 10.3 mg/dL 9.7  Total Protein 6.5 - 8.1 g/dL 9.3(Z)  Total Bilirubin 0.3 - 1.2 mg/dL 0.5  Alkaline Phos 38 - 126 U/L 198(H)  AST 15 - 41 U/L 19  ALT 0 - 44 U/L 17   Date of discharge: 06/12/2019 Discharge Diagnoses: Term Pregnancy-delivered Discharge instruction: per After Visit Summary and "Baby and Me Booklet".  Activity:           pelvic rest Advance as tolerated. Pelvic rest for 6 weeks.  Diet:                routine Medications: Iron and Oxy IR, Procardia 30mg  XL, simethicone Postpartum contraception: Condoms Condition:  Pt discharge to home with baby in stable condition  Meds: Allergies as of 06/12/2019   No Known Allergies     Medication List    STOP taking these medications   glyBURIDE 2.5 MG tablet Commonly known as: DIABETA   prenatal multivitamin Tabs tablet   valACYclovir 1000 MG tablet Commonly known as: VALTREX     TAKE these medications   ferrous sulfate 325 (65 FE) MG tablet Take 1 tablet (325 mg total) by mouth 2 (two) times daily with a meal.   NIFEdipine 30 MG 24 hr tablet Commonly known as: ADALAT CC Take 1 tablet (30 mg total) by mouth daily. Start taking on: June 13, 2019   oxyCODONE 5 MG immediate release tablet Commonly known as: Oxy IR/ROXICODONE Take 1 tablet (5 mg total) by mouth every 6 (six) hours as needed for moderate pain. What changed:   when to take this  reasons to take this   sertraline 50 MG tablet Commonly known as: ZOLOFT Take 1 tablet (50 mg total) by mouth daily. Start taking on: June 13, 2019   simethicone 80 MG chewable tablet Commonly known as: MYLICON Chew 1 tablet (80 mg total) by mouth as needed for  flatulence.       Discharge Follow Up:  Follow-up Information    June 15, 2019, MD. Schedule an appointment as soon as possible for a visit in 1 week(s).   Specialty: Obstetrics and Gynecology Why: please  advise patient to make an appointment for bp check and removal of prevena in 1 week  Contact information: [address] [place] [address] [phone]            Andy Gauss. Andris Baumann, MSN 06/12/2019, 12:01 PM

## 2019-06-13 ENCOUNTER — Ambulatory Visit: Payer: Self-pay

## 2019-06-13 NOTE — Lactation Note (Signed)
This note was copied from a baby's chart. Lactation Consultation Note  Patient Name: Kathy Mason [id number] Date: 06/13/2019 Reason for consult: Follow-up assessment;Primapara;1st time breastfeeding;NICU baby;Term  1130 - 1200 - I conducted a discharge visit with Kathy Mason. She states that she is now pumping three ounces per breast. She states that pumping makes her feel "empowered." She also enjoys breast feeding, and her plan is to pump and breast feed.  We discussed pumping and engorgement. She understands that if she begins to develop oversupply she can begin to reduce her production slowly by pumping a little less over a period of time. Understands supply and demand nature of milk production.  Kathy Mason asked for a [facility] loaner pump, and she has contact with [employer] to pick up a pump next week. Indications for pumping include discharge from a NICU and preference for pumping and breast feeding.   I processed her paperwork and supplies her with a breast pump. She provided the deposit, and I provided instructions for return by June 21.  We reviewed her plan for discharge. She will continue to put baby [place] to the breast and feed using her nipple shield. She will pump anytime baby receives a bottle, but I indicated that based on observed feedings, she does not need to post pump unless she is engorged and needs to comfort pump.  I also reviewed our community breast feeding resources. Kathy Mason is interested in follow up and indicated she would definitely call if she had any questions or concerns. She is scheduled to follow up with [facility] in [facility], but she plans to try to find another provider closer to home.   I congratulated Kathy Mason and encouraged her to call us with an update in the future. She thanked me for my help.  Maternal Data Formula Feeding for Exclusion: No Has patient been taught Hand Expression?: Yes Does the patient have breastfeeding experience prior to this  delivery?: No  Feeding Feeding Type: Bottle Fed - Breast Milk Nipple Type: Nfant Extra Slow Flow (gold)  Interventions Interventions: Breast feeding basics reviewed;DEBP  Lactation Tools Discussed/Used Pump Review: Setup, frequency, and cleaning;Milk Storage   Consult Status Consult Status: Complete Date: 06/13/19    Walker Shadow 06/13/2019, 12:07 PM

## 2019-10-09 ENCOUNTER — Other Ambulatory Visit: Payer: Self-pay

## 2019-10-09 ENCOUNTER — Encounter (HOSPITAL_COMMUNITY): Payer: Self-pay

## 2019-10-09 ENCOUNTER — Ambulatory Visit (HOSPITAL_COMMUNITY)
Admission: EM | Admit: 2019-10-09 | Discharge: 2019-10-09 | Disposition: A | Discharge status: Home / Self Care | Payer: Medicaid Other | Attending: Family Medicine | Admitting: Family Medicine

## 2019-10-09 DIAGNOSIS — H05012 Cellulitis of left orbit: Secondary | ICD-10-CM | POA: Diagnosis not present

## 2019-10-09 DIAGNOSIS — R220 Localized swelling, mass and lump, head: Secondary | ICD-10-CM | POA: Diagnosis not present

## 2019-10-09 MED ORDER — SULFAMETHOXAZOLE-TRIMETHOPRIM 800-160 MG PO TABS: 1.0000 | ORAL_TABLET | Freq: Two times a day (BID) | ORAL | 0 refills | Status: AC

## 2019-10-09 NOTE — ED Triage Notes (Signed)
Pt presents with left eye redness & swelling with left side facial swelling since Monday.

## 2019-10-09 NOTE — Discharge Instructions (Signed)
Have sent in Bactrim for you to take twice a day for 7 days to cover the infection around her eye.  Follow-up with this office if there is not a significant difference 24 hours after you start the antibiotic  Follow-up with the ER for changes in vision, increased swelling, drainage from the eye,

## 2019-10-09 NOTE — ED Provider Notes (Signed)
[facility]   [id number] 10/09/19 Arrival Time: 1447  CC: EYE REDNESS  SUBJECTIVE:  Kathy Mason is a 34 y.o. female who presents with complaint of eye redness that began 2 days ago.  Reports that now the redness has spread to her left cheek and that the left side of her face is swelling and is tender to touch.  Has not taken OTC medication for this.  Has never had any symptoms like this before there are no aggravating or alleviating factors. Denies a precipitating event, trauma, or close contacts with similar symptoms. Denies fever, chills, nausea, vomiting, eye pain, painful eye movements, halos, discharge, itching, vision changes, double vision, FB sensation, periorbital erythemaDenies contact lens use.    ROS: As per HPI.  All other pertinent ROS negative.     Past Medical History:  Diagnosis Date  . Depression   . PCOS (polycystic ovarian syndrome)   . Vaginal Pap smear, abnormal    HPV   Past Surgical History:  Procedure Laterality Date  . ANKLE FRACTURE SURGERY    . CESAREAN SECTION N/A 06/09/2019   Procedure: CESAREAN SECTION;  Surgeon: Gerald Leitz, MD;  Location: [facility] LD ORS;  Service: Obstetrics;  Laterality: N/A;  . CHOLECYSTECTOMY N/A 01/18/2016   Procedure: LAPAROSCOPIC CHOLECYSTECTOMY WITH INTRAOPERATIVE CHOLANGIOGRAM;  Surgeon: Luretha Murphy, MD;  Location: [facility] ORS;  Service: General;  Laterality: N/A;  . WISDOM TOOTH EXTRACTION     No Known Allergies No current facility-administered medications on file prior to encounter.   Current Outpatient Medications on File Prior to Encounter  Medication Sig Dispense Refill  . ferrous sulfate 325 (65 FE) MG tablet Take 1 tablet (325 mg total) by mouth 2 (two) times daily with a meal. 30 tablet 3  . NIFEdipine (ADALAT CC) 30 MG 24 hr tablet Take 1 tablet (30 mg total) by mouth daily. 30 tablet 0  . oxyCODONE (OXY IR/ROXICODONE) 5 MG immediate release tablet Take 1 tablet (5 mg total) by mouth every 6 (six) hours  as needed for moderate pain. 15 tablet 0  . sertraline (ZOLOFT) 50 MG tablet Take 1 tablet (50 mg total) by mouth daily. 30 tablet 0  . simethicone (MYLICON) 80 MG chewable tablet Chew 1 tablet (80 mg total) by mouth as needed for flatulence. 30 tablet 0   Social History   Socioeconomic History  . Marital status: Single    Spouse name: Not on file  . Number of children: Not on file  . Years of education: Not on file  . Highest education level: Not on file  Occupational History  . Not on file  Tobacco Use  . Smoking status: Never Smoker  . Smokeless tobacco: Never Used  Vaping Use  . Vaping Use: Never used  Substance and Sexual Activity  . Alcohol use: Not on file    Comment: occasional/ couple times year  . Drug use: No  . Sexual activity: Yes    Birth control/protection: Condom    Comment: states her menses are always irregular  Other Topics Concern  . Not on file  Social History Narrative  . Not on file   Social Determinants of Health   Financial Resource Strain:   . Difficulty of Paying Living Expenses: Not on file  Food Insecurity:   . Worried About Programme researcher, broadcasting/film/video in the Last Year: Not on file  . Ran Out of Food in the Last Year: Not on file  Transportation Needs:   . Lack of Transportation (  Medical): Not on file  . Lack of Transportation (Non-Medical): Not on file  Physical Activity:   . Days of Exercise per Week: Not on file  . Minutes of Exercise per Session: Not on file  Stress:   . Feeling of Stress : Not on file  Social Connections:   . Frequency of Communication with Friends and Family: Not on file  . Frequency of Social Gatherings with Friends and Family: Not on file  . Attends Religious Services: Not on file  . Active Member of Clubs or Organizations: Not on file  . Attends Banker Meetings: Not on file  . Marital Status: Not on file  Intimate Partner Violence:   . Fear of Current or Ex-Partner: Not on file  . Emotionally Abused:  Not on file  . Physically Abused: Not on file  . Sexually Abused: Not on file   Family History  Problem Relation Age of Onset  . Stroke Mother   . Hypertension Mother   . Diabetes Mother   . Kidney disease Mother   . Hypertension Father   . Diabetes Father   . Hypertension Brother   . Diabetes Brother   . Sarcoidosis Brother   . Hypertension Maternal Grandmother   . Diabetes Maternal Grandmother   . Breast cancer Maternal Grandmother   . Hypertension Maternal Grandfather     OBJECTIVE:     Bilateral Near:      Vitals:   10/09/19 1604  BP: 133/85  Pulse: 64  Resp: 18  Temp: 98 F (36.7 C)  TempSrc: Oral  SpO2: 100%    General appearance: alert; no distress Eyes: Left upper and lower conjunctival erythema, scleral erythema. PERRL; EOMI without discomfort;  no obvious drainage; lid everted without obvious FB; no obvious fluorescein uptake  Neck: supple Lungs: clear to auscultation bilaterally Heart: regular rate and rhythm Skin: warm and dry, erythema, swelling, heat touch surrounding left orbit Psychological: alert and cooperative; normal mood and affect   ASSESSMENT & PLAN:  1. Cellulitis of left orbital region   2. Facial swelling     Meds ordered this encounter  Medications  . sulfamethoxazole-trimethoprim (BACTRIM DS) 800-160 MG tablet    Sig: Take 1 tablet by mouth 2 (two) times daily for 7 days.    Dispense:  14 tablet    Refill:  0    Order Specific Question:   Supervising Provider    Answer:   Merrilee Jansky [id number]      Prescribed Bactrim  If not improving over 24 hours after starting medication, follow-up with ophthalmology or with the ER follow up with ophthalmology for further evaluation and management if symptoms persists Return or go to ER if you have any new or worsening symptoms such as fever, chills, redness, swelling, eye pain, painful eye movements, vision changes.  Reviewed expectations re: course of current medical issues.  Questions answered. Outlined signs and symptoms indicating need for more acute intervention. Patient verbalized understanding. After Visit Summary given.   Moshe Cipro, NP 10/09/19 1627

## 2020-12-08 ENCOUNTER — Other Ambulatory Visit: Payer: Self-pay

## 2020-12-08 ENCOUNTER — Ambulatory Visit (HOSPITAL_COMMUNITY)
Admission: EM | Admit: 2020-12-08 | Discharge: 2020-12-08 | Disposition: A | Discharge status: Home / Self Care | Payer: Medicaid Other | Attending: Physician Assistant | Admitting: Physician Assistant

## 2020-12-08 ENCOUNTER — Encounter (HOSPITAL_COMMUNITY): Payer: Self-pay

## 2020-12-08 DIAGNOSIS — J069 Acute upper respiratory infection, unspecified: Secondary | ICD-10-CM | POA: Diagnosis not present

## 2020-12-08 DIAGNOSIS — R051 Acute cough: Secondary | ICD-10-CM | POA: Diagnosis present

## 2020-12-08 DIAGNOSIS — Z8616 Personal history of COVID-19: Secondary | ICD-10-CM | POA: Insufficient documentation

## 2020-12-08 DIAGNOSIS — Z20822 Contact with and (suspected) exposure to covid-19: Secondary | ICD-10-CM | POA: Diagnosis not present

## 2020-12-08 LAB — RESPIRATORY PANEL BY PCR

## 2020-12-08 LAB — SARS CORONAVIRUS 2 (TAT 6-24 HRS): SARS Coronavirus 2: NEGATIVE

## 2020-12-08 MED ORDER — PROMETHAZINE-DM 6.25-15 MG/5ML PO SYRP
5.0000 mL | ORAL_SOLUTION | Freq: Three times a day (TID) | ORAL | 0 refills | Status: DC | PRN
Start: 2020-12-08 — End: 2021-12-27

## 2020-12-08 MED ORDER — PREDNISONE 20 MG PO TABS: 40.0000 mg | ORAL_TABLET | Freq: Every day | ORAL | 0 refills | Status: AC

## 2020-12-08 NOTE — Discharge Instructions (Signed)
I believe that you have a viral upper respiratory infection.  Start prednisone to help with your symptoms.  Do not take NSAIDs including aspirin, ibuprofen/Advil, naproxen/Aleve due to risk of GI bleeding.  You can use Tylenol, Mucinex, Flonase for additional symptom relief.  Use Promethazine DM up to 3 times a day for cough.  This make you sleepy do not drive or drink alcohol while taking it.  Make sure you rest and drink plenty of fluid.  We will contact you if your viral testing is positive.  If symptoms are not improving after you complete course of prednisone or if at any point anything worsens please return for reevaluation.

## 2020-12-08 NOTE — ED Provider Notes (Signed)
[facility]    CSN: [id number] Arrival date & time: 12/08/20  [id number]      History   Chief Complaint Chief Complaint  Patient presents with   Cough   Sore Throat    HPI Kathy Mason is a 35 y.o. female.   Patient presents today with a 2 to 3-day history of URI symptoms.  Reports cough, nasal congestion, sneezing, sore throat, headache.  Denies fever, chest pain, shortness of breath, nausea, vomiting.  She has tried elderberry, vitamin C, soup, tea, over-the-counter medications with improvement of symptoms.  Reports household sick contacts with similar symptoms that have since resolved.  Denies any recent antibiotic use.  Denies history of asthma, allergies, COPD, smoking.  Does have a history of gestational diabetes but denies diabetes.  She has not had COVID-19 or influenza vaccine.  She has had COVID several years ago.  Reports she is having difficulty with heel activities as result of symptoms.   Past Medical History:  Diagnosis Date   Depression    PCOS (polycystic ovarian syndrome)    Vaginal Pap smear, abnormal    HPV    Patient Active Problem List   Diagnosis Date Noted   Gestational diabetes 06/09/2019   Abnormal glucose tolerance test (GTT) during pregnancy, antepartum 04/08/2019   Status post laparoscopic cholecystectomy Jan 2018 01/18/2016    Past Surgical History:  Procedure Laterality Date   ANKLE FRACTURE SURGERY     CESAREAN SECTION N/A 06/09/2019   Procedure: CESAREAN SECTION;  Surgeon: Christophe Louis, MD;  Location: [facility] LD ORS;  Service: Obstetrics;  Laterality: N/A;   CHOLECYSTECTOMY N/A 01/18/2016   Procedure: LAPAROSCOPIC CHOLECYSTECTOMY WITH INTRAOPERATIVE CHOLANGIOGRAM;  Surgeon: Johnathan Hausen, MD;  Location: [facility] ORS;  Service: General;  Laterality: N/A;   WISDOM TOOTH EXTRACTION      OB History     Gravida  1   Para  1   Term  1   Preterm      AB      Living  1      SAB      IAB      Ectopic      Multiple  0    Live Births  1            Home Medications    Prior to Admission medications   Medication Sig Start Date End Date Taking? Authorizing Provider  predniSONE (DELTASONE) 20 MG tablet Take 2 tablets (40 mg total) by mouth daily for 5 days. 12/08/20 12/13/20 Yes Leeon Makar K, PA-C  promethazine-dextromethorphan (PROMETHAZINE-DM) 6.25-15 MG/5ML syrup Take 5 mLs by mouth 3 (three) times daily as needed for cough. 12/08/20  Yes Crystalle Popwell, Derry Skill, PA-C  ferrous sulfate 325 (65 FE) MG tablet Take 1 tablet (325 mg total) by mouth 2 (two) times daily with a meal. 06/12/19   Suzan Nailer, CNM  NIFEdipine (ADALAT CC) 30 MG 24 hr tablet Take 1 tablet (30 mg total) by mouth daily. 06/13/19   Suzan Nailer, CNM  oxyCODONE (OXY IR/ROXICODONE) 5 MG immediate release tablet Take 1 tablet (5 mg total) by mouth every 6 (six) hours as needed for moderate pain. 06/12/19   Suzan Nailer, CNM  sertraline (ZOLOFT) 50 MG tablet Take 1 tablet (50 mg total) by mouth daily. 06/13/19   Suzan Nailer, CNM  simethicone (MYLICON) 80 MG chewable tablet Chew 1 tablet (80 mg total) by mouth as needed for flatulence. 06/12/19   Suzan Nailer, CNM  Family History Family History  Problem Relation Age of Onset   Stroke Mother    Hypertension Mother    Diabetes Mother    Kidney disease Mother    Hypertension Father    Diabetes Father    Hypertension Brother    Diabetes Brother    Sarcoidosis Brother    Hypertension Maternal Grandmother    Diabetes Maternal Grandmother    Breast cancer Maternal Grandmother    Hypertension Maternal Grandfather     Social History Social History   Tobacco Use   Smoking status: Never   Smokeless tobacco: Never  Vaping Use   Vaping Use: Never used  Substance Use Topics   Alcohol use: Not Currently    Comment: occasional/ couple times year   Drug use: No     Allergies   Patient has no known allergies.   Review of Systems Review of Systems  Constitutional:  Positive for  activity change. Negative for appetite change, fatigue and fever.  HENT:  Positive for congestion, postnasal drip and sore throat. Negative for sinus pressure and sneezing.   Respiratory:  Positive for cough. Negative for shortness of breath.   Cardiovascular:  Negative for chest pain.  Gastrointestinal:  Negative for abdominal pain, diarrhea, nausea and vomiting.  Musculoskeletal:  Negative for arthralgias and myalgias.  Neurological:  Positive for headaches. Negative for dizziness and light-headedness.    Physical Exam Triage Vital Signs ED Triage Vitals  Enc Vitals Group     BP 12/08/20 1002 (!) 126/92     Pulse Rate 12/08/20 1002 82     Resp 12/08/20 1002 18     Temp 12/08/20 1002 99.5 F (37.5 C)     Temp Source 12/08/20 1002 Oral     SpO2 12/08/20 1002 99 %     Weight --      Height --      Head Circumference --      Peak Flow --      Pain Score 12/08/20 1000 2     Pain Loc --      Pain Edu? --      Excl. in GC? --    No data found.  Updated Vital Signs BP (!) 126/92 (BP Location: Right Arm)   Pulse 82   Temp 99.5 F (37.5 C) (Oral)   Resp 18   LMP 12/06/2020 (Exact Date)   SpO2 99%   Visual Acuity Right Eye Distance:   Left Eye Distance:   Bilateral Distance:    Right Eye Near:   Left Eye Near:    Bilateral Near:     Physical Exam Vitals reviewed.  Constitutional:      General: She is awake. She is not in acute distress.    Appearance: Normal appearance. She is well-developed. She is not ill-appearing.     Comments: Very pleasant female appears stated age in no acute distress sitting comfortably in exam room  HENT:     Head: Normocephalic and atraumatic.     Right Ear: Tympanic membrane, ear canal and external ear normal. Tympanic membrane is not erythematous or bulging.     Left Ear: Tympanic membrane, ear canal and external ear normal. Tympanic membrane is not erythematous or bulging.     Nose:     Right Sinus: No maxillary sinus tenderness or  frontal sinus tenderness.     Left Sinus: No maxillary sinus tenderness or frontal sinus tenderness.     Mouth/Throat:     Pharynx: Uvula midline.  Posterior oropharyngeal erythema present. No oropharyngeal exudate.  Cardiovascular:     Rate and Rhythm: Normal rate and regular rhythm.     Heart sounds: Normal heart sounds, S1 normal and S2 normal. No murmur heard. Pulmonary:     Effort: Pulmonary effort is normal.     Breath sounds: Normal breath sounds. No wheezing, rhonchi or rales.     Comments: Clear to auscultation bilaterally Psychiatric:        Behavior: Behavior is cooperative.     UC Treatments / Results  Labs (all labs ordered are listed, but only abnormal results are displayed) Labs Reviewed  RESPIRATORY PANEL BY PCR  SARS CORONAVIRUS 2 (TAT 6-24 HRS)    EKG   Radiology No results found.  Procedures Procedures (including critical care time)  Medications Ordered in UC Medications - No data to display  Initial Impression / Assessment and Plan / UC Course  I have reviewed the triage vital signs and the nursing notes.  Pertinent labs & imaging results that were available during my care of the patient were reviewed by me and considered in my medical decision making (see chart for details).     Discussed likely viral etiology given short duration of symptoms and no evidence of acute infection that warrant initiation of antibiotics today.  Viral testing was obtained-results pending.  Patient was given work excuse note with current CDC return to work guidelines based on COVID test result.  She was given prednisone to help with symptoms with instruction not to take NSAIDs.  She was prescribed Promethazine DM for cough with instruction to drive or drink alcohol while taking this medication as drowsiness is a common side effect.  She can use Mucinex, Flonase, Tylenol for additional symptom relief.  Recommended she rest and drink plenty of fluid.  Discussed alarm symptoms  that warrant emergent evaluation.  Strict return precautions given to which she expressed understanding.  Final Clinical Impressions(s) / UC Diagnoses   Final diagnoses:  Upper respiratory tract infection, unspecified type  Acute cough     Discharge Instructions      I believe that you have a viral upper respiratory infection.  Start prednisone to help with your symptoms.  Do not take NSAIDs including aspirin, ibuprofen/Advil, naproxen/Aleve due to risk of GI bleeding.  You can use Tylenol, Mucinex, Flonase for additional symptom relief.  Use Promethazine DM up to 3 times a day for cough.  This make you sleepy do not drive or drink alcohol while taking it.  Make sure you rest and drink plenty of fluid.  We will contact you if your viral testing is positive.  If symptoms are not improving after you complete course of prednisone or if at any point anything worsens please return for reevaluation.     ED Prescriptions     Medication Sig Dispense Auth. Provider   predniSONE (DELTASONE) 20 MG tablet Take 2 tablets (40 mg total) by mouth daily for 5 days. 10 tablet Jackye Dever K, PA-C   promethazine-dextromethorphan (PROMETHAZINE-DM) 6.25-15 MG/5ML syrup Take 5 mLs by mouth 3 (three) times daily as needed for cough. 118 mL Declan Mier K, PA-C      PDMP not reviewed this encounter.   Terrilee Croak, PA-C 12/08/20 1025

## 2020-12-08 NOTE — ED Triage Notes (Signed)
Pt reports cough, nasal congestion, sneezing, sore throat, pressure in ears x 3 days. OTC meds gives some relief.

## 2020-12-19 ENCOUNTER — Encounter (HOSPITAL_COMMUNITY): Payer: Self-pay | Admitting: Emergency Medicine

## 2020-12-19 ENCOUNTER — Other Ambulatory Visit: Payer: Self-pay

## 2020-12-19 ENCOUNTER — Ambulatory Visit (HOSPITAL_COMMUNITY)
Admission: EM | Admit: 2020-12-19 | Discharge: 2020-12-19 | Disposition: A | Discharge status: Home / Self Care | Payer: Medicaid Other | Attending: Family Medicine | Admitting: Family Medicine

## 2020-12-19 DIAGNOSIS — J0100 Acute maxillary sinusitis, unspecified: Secondary | ICD-10-CM

## 2020-12-19 MED ORDER — AMOXICILLIN 875 MG PO TABS: 875.0000 mg | ORAL_TABLET | Freq: Two times a day (BID) | ORAL | 0 refills | Status: AC

## 2020-12-19 NOTE — ED Triage Notes (Addendum)
Seen 12/08/2020.  No improvement.  Continues with sore throat, coughing and sneezing. Patient has slight off balance feeling when bending forward.  Was told if no improvement to return and could have antibiotics.  Patient complains of right ey pinkness

## 2020-12-19 NOTE — ED Provider Notes (Signed)
[facility]   [id number] 12/19/20 Arrival Time: 1139  ASSESSMENT & PLAN:  1. Acute non-recurrent maxillary sinusitis    Begin: Meds ordered this encounter  Medications   amoxicillin (AMOXIL) 875 MG tablet    Sig: Take 1 tablet (875 mg total) by mouth 2 (two) times daily for 10 days.    Dispense:  20 tablet    Refill:  0    Discussed typical duration of symptoms. OTC symptom care as needed.   Follow-up Information     Wilfrid Lund, PA.   Specialty: Family Medicine Why: As needed. Contact information: [address] [place] [address] [phone]                 Reviewed expectations re: course of current medical issues. Questions answered. Outlined signs and symptoms indicating need for more acute intervention. Patient verbalized understanding. After Visit Summary given.   SUBJECTIVE: History from: patient.  Kathy Mason is a 35 y.o. female who was seen here for URI on 12/08/20; note reviewed. Cough is better. Now with maxillary sinus pain/pressure. Mild ST. Overall normal PO intake without n/v. OTC treatment: none. Seasonal allergies: no. History of frequent sinus infections: no. No specific aggravating or alleviating factors reported. Social History   Tobacco Use  Smoking Status Never  Smokeless Tobacco Never    ROS: As per HPI.  OBJECTIVE:  Vitals:   12/19/20 1342  BP: 132/89  Pulse: 81  Resp: 20  Temp: 98.8 F (37.1 C)  TempSrc: Oral  SpO2: 100%     General appearance: alert; no distress HEENT: nasal congestion; clear runny nose; throat irritation secondary to post-nasal drainage; bilateral maxillary tenderness to palpation; turbinates boggy Neck: supple without LAD; trachea midline Lungs: unlabored respirations, symmetrical air entry; cough: absent; no respiratory distress Skin: warm and dry Psychological: alert and cooperative; normal mood and affect  No Known Allergies  Past Medical History:   Diagnosis Date   Depression    PCOS (polycystic ovarian syndrome)    Vaginal Pap smear, abnormal    HPV   Family History  Problem Relation Age of Onset   Stroke Mother    Hypertension Mother    Diabetes Mother    Kidney disease Mother    Hypertension Father    Diabetes Father    Hypertension Brother    Diabetes Brother    Sarcoidosis Brother    Hypertension Maternal Grandmother    Diabetes Maternal Grandmother    Breast cancer Maternal Grandmother    Hypertension Maternal Grandfather    Social History   Socioeconomic History   Marital status: Single    Spouse name: Not on file   Number of children: Not on file   Years of education: Not on file   Highest education level: Not on file  Occupational History   Not on file  Tobacco Use   Smoking status: Never   Smokeless tobacco: Never  Vaping Use   Vaping Use: Never used  Substance and Sexual Activity   Alcohol use: Not Currently    Comment: occasional/ couple times year   Drug use: No   Sexual activity: Yes    Birth control/protection: Condom    Comment: states her menses are always irregular  Other Topics Concern   Not on file  Social History Narrative   Not on file   Social Determinants of Health   Financial Resource Strain: Not on file  Food Insecurity: Not on file  Transportation Needs: Not  on file  Physical Activity: Not on file  Stress: Not on file  Social Connections: Not on file  Intimate Partner Violence: Not on file             Mardella Layman, MD 12/19/20 1447

## 2021-12-27 ENCOUNTER — Ambulatory Visit (HOSPITAL_COMMUNITY)
Admission: EM | Admit: 2021-12-27 | Discharge: 2021-12-27 | Disposition: A | Discharge status: Home / Self Care | Payer: Medicaid Other | Attending: Physician Assistant | Admitting: Physician Assistant

## 2021-12-27 ENCOUNTER — Encounter (HOSPITAL_COMMUNITY): Payer: Self-pay

## 2021-12-27 DIAGNOSIS — R051 Acute cough: Secondary | ICD-10-CM | POA: Insufficient documentation

## 2021-12-27 DIAGNOSIS — J069 Acute upper respiratory infection, unspecified: Secondary | ICD-10-CM

## 2021-12-27 DIAGNOSIS — Z7952 Long term (current) use of systemic steroids: Secondary | ICD-10-CM | POA: Insufficient documentation

## 2021-12-27 DIAGNOSIS — Z1152 Encounter for screening for COVID-19: Secondary | ICD-10-CM | POA: Insufficient documentation

## 2021-12-27 DIAGNOSIS — Z79899 Other long term (current) drug therapy: Secondary | ICD-10-CM | POA: Diagnosis not present

## 2021-12-27 MED ORDER — PREDNISONE 20 MG PO TABS: 40.0000 mg | ORAL_TABLET | Freq: Every day | ORAL | 0 refills | Status: DC

## 2021-12-27 MED ORDER — FLUTICASONE PROPIONATE 50 MCG/ACT NA SUSP: 1.0000 | Freq: Every day | NASAL | 0 refills | Status: DC

## 2021-12-27 MED ORDER — PROMETHAZINE-DM 6.25-15 MG/5ML PO SYRP: 5.0000 mL | ORAL_SOLUTION | Freq: Three times a day (TID) | ORAL | 0 refills | Status: DC | PRN

## 2021-12-27 NOTE — ED Provider Notes (Signed)
[place]    CSN: [id number] Arrival date & time: 12/27/21  1626      History   Chief Complaint No chief complaint on file.   HPI Kathy Mason is a 36 y.o. female.   Patient presents today with a 3 to 4-day history of URI symptoms including rhinorrhea, sinus congestion, headache, cough, sore throat.  Denies any chest pain, shortness of breath, nausea, vomiting, fever.  She denies any known sick contacts but does have a 43-1/2-year-old that is in school and so is exposed to many children.  She has had COVID in the past several years ago.  She has not had COVID or influenza vaccines.  Denies history of allergies, asthma, COPD, smoking.  She has tried Mucinex and cold/flu medication without improvement of symptoms.  Denies any recent antibiotics or steroids.     Past Medical History:  Diagnosis Date   Depression    PCOS (polycystic ovarian syndrome)    Vaginal Pap smear, abnormal    HPV    Patient Active Problem List   Diagnosis Date Noted   Gestational diabetes 06/09/2019   Abnormal glucose tolerance test (GTT) during pregnancy, antepartum 04/08/2019   Status post laparoscopic cholecystectomy Jan 2018 01/18/2016    Past Surgical History:  Procedure Laterality Date   ANKLE FRACTURE SURGERY     CESAREAN SECTION N/A 06/09/2019   Procedure: CESAREAN SECTION;  Surgeon: Christophe Louis, MD;  Location: [facility];  Service: Obstetrics;  Laterality: N/A;   CHOLECYSTECTOMY N/A 01/18/2016   Procedure: LAPAROSCOPIC CHOLECYSTECTOMY WITH INTRAOPERATIVE CHOLANGIOGRAM;  Surgeon: Johnathan Hausen, MD;  Location: [facility];  Service: General;  Laterality: N/A;   WISDOM TOOTH EXTRACTION      OB History     Gravida  1   Para  1   Term  1   Preterm      AB      Living  1      SAB      IAB      Ectopic      Multiple  0   Live Births  1            Home Medications    Prior to Admission medications   Medication Sig Start Date End Date Taking?  Authorizing Provider  fluticasone (FLONASE) 50 MCG/ACT nasal spray Place 1 spray into both nostrils daily. 12/27/21  Yes Amal Renbarger K, PA-C  predniSONE (DELTASONE) 20 MG tablet Take 2 tablets (40 mg total) by mouth daily. 12/27/21  Yes Tayla Panozzo K, PA-C  ferrous sulfate 325 (65 FE) MG tablet Take 1 tablet (325 mg total) by mouth 2 (two) times daily with a meal. 06/12/19   Suzan Nailer, CNM  NIFEdipine (ADALAT CC) 30 MG 24 hr tablet Take 1 tablet (30 mg total) by mouth daily. Patient not taking: Reported on 12/19/2020 06/13/19   Suzan Nailer, CNM  oxyCODONE (OXY IR/ROXICODONE) 5 MG immediate release tablet Take 1 tablet (5 mg total) by mouth every 6 (six) hours as needed for moderate pain. Patient not taking: Reported on 12/19/2020 06/12/19   Suzan Nailer, CNM  promethazine-dextromethorphan (PROMETHAZINE-DM) 6.25-15 MG/5ML syrup Take 5 mLs by mouth 3 (three) times daily as needed for cough. 12/27/21   Blayke Pinera, Derry Skill, PA-C  sertraline (ZOLOFT) 50 MG tablet Take 1 tablet (50 mg total) by mouth daily. Patient not taking: Reported on 12/19/2020 06/13/19   Suzan Nailer, CNM  simethicone (MYLICON) 80 MG chewable tablet Chew 1  tablet (80 mg total) by mouth as needed for flatulence. 06/12/19   June LeapJones, Amanda K, CNM    Family History Family History  Problem Relation Age of Onset   Stroke Mother    Hypertension Mother    Diabetes Mother    Kidney disease Mother    Hypertension Father    Diabetes Father    Hypertension Brother    Diabetes Brother    Sarcoidosis Brother    Hypertension Maternal Grandmother    Diabetes Maternal Grandmother    Breast cancer Maternal Grandmother    Hypertension Maternal Grandfather     Social History Social History   Tobacco Use   Smoking status: Never   Smokeless tobacco: Never  Vaping Use   Vaping Use: Never used  Substance Use Topics   Alcohol use: Not Currently    Comment: occasional/ couple times year   Drug use: No     Allergies   Patient  has no known allergies.   Review of Systems Review of Systems  Constitutional:  Positive for activity change and fatigue. Negative for appetite change and fever.  HENT:  Positive for congestion, sinus pressure and sore throat. Negative for sneezing.   Respiratory:  Positive for cough. Negative for shortness of breath.   Cardiovascular:  Negative for chest pain.  Gastrointestinal:  Negative for abdominal pain, diarrhea, nausea and vomiting.  Neurological:  Positive for headaches. Negative for dizziness and light-headedness.     Physical Exam Triage Vital Signs ED Triage Vitals  Enc Vitals Group     BP 12/27/21 1951 (!) 145/105     Pulse Rate 12/27/21 1951 91     Resp 12/27/21 1951 12     Temp --      Temp Source 12/27/21 1951 Oral     SpO2 12/27/21 1951 98 %     Weight --      Height --      Head Circumference --      Peak Flow --      Pain Score 12/27/21 1948 5     Pain Loc --      Pain Edu? --      Excl. in [place]? --    No data found.  Updated Vital Signs BP (!) 145/105 (BP Location: Left Arm)   Pulse 91   Resp 12   LMP 12/24/2021   SpO2 98%   Visual Acuity Right Eye Distance:   Left Eye Distance:   Bilateral Distance:    Right Eye Near:   Left Eye Near:    Bilateral Near:     Physical Exam Vitals reviewed.  Constitutional:      General: She is awake. She is not in acute distress.    Appearance: Normal appearance. She is well-developed. She is not ill-appearing.     Comments: Very pleasant female appears stated age in no acute distress sitting comfortably in exam room  HENT:     Head: Normocephalic and atraumatic.     Right Ear: Ear canal and external ear normal. A middle ear effusion is present. Tympanic membrane is not erythematous or bulging.     Left Ear: Ear canal and external ear normal. A middle ear effusion is present. Tympanic membrane is not erythematous or bulging.     Nose:     Right Sinus: No maxillary sinus tenderness or frontal sinus  tenderness.     Left Sinus: No maxillary sinus tenderness or frontal sinus tenderness.     Mouth/Throat:  Pharynx: Uvula midline. No oropharyngeal exudate or posterior oropharyngeal erythema.  Cardiovascular:     Rate and Rhythm: Normal rate and regular rhythm.     Heart sounds: Normal heart sounds, S1 normal and S2 normal. No murmur heard. Pulmonary:     Effort: Pulmonary effort is normal.     Breath sounds: Normal breath sounds. No wheezing, rhonchi or rales.     Comments: Clear to auscultation bilaterally Psychiatric:        Behavior: Behavior is cooperative.      UC Treatments / Results  Labs (all labs ordered are listed, but only abnormal results are displayed) Labs Reviewed  SARS CORONAVIRUS 2 (TAT 6-24 HRS)    EKG   Radiology No results found.  Procedures Procedures (including critical care time)  Medications Ordered in UC Medications - No data to display  Initial Impression / Assessment and Plan / UC Course  I have reviewed the triage vital signs and the nursing notes.  Pertinent labs & imaging results that were available during my care of the patient were reviewed by me and considered in my medical decision making (see chart for details).     Patient is well-appearing, afebrile, nontoxic, nontachycardic.  No evidence of acute infection on physical exam that warrant initiation of antibiotics.  Discussed likely viral etiology.  Patient has been symptomatic for 3 to 4 days to outside of the window for Tamiflu and flu testing was deferred.  COVID testing is pending.  She is a candidate for antivirals if she is interested should she test positive for COVID.  She has not had a recent metabolic panel so would need to consider molnupiravir.  She was prescribed meclizine DM for cough with instruction to drive drink alcohol with taking this medication as drowsiness is a common side effect.  She can use over-the-counter medication including Tylenol and Mucinex for symptom  relief.  She was prescribed Flonase to help with congestion.  Will start prednisone burst of 40 mg for 4 days to help with symptoms with instruction not to take NSAIDs with this medication due to risk of GI bleeding.  Patient did ask about antibiotics but discussed that there is no indication for them based on her exam today.  She is to rest and plenty of fluid.  Discussed that if her symptoms or not improving within a week she is to return for reevaluation.  If she has any worsening symptoms including worsening cough, shortness of breath, chest pain, nausea/vomiting interfere with oral intake, weakness she needs to be seen immediately.  Strict return precautions given.  Work excuse note with current CDC return to work guidelines based on COVID test result provided.  Final Clinical Impressions(s) / UC Diagnoses   Final diagnoses:  Upper respiratory tract infection, unspecified type  Acute cough     Discharge Instructions       monitor your MyChart for your COVID results.  We will contact you if you are positive.  Use promethazine DM for cough.  This would make you sleepy so do not drive drink alcohol taking it.  Take prednisone 40 mg for 4 days.  Do not take NSAIDs with this medication including aspirin, ibuprofen/Advil, naproxen/Aleve.  Use Flonase as well as over-the-counter medications such as Tylenol and Mucinex.  Make sure you rest and drink plenty of fluid.  If your symptoms are not improving within a week please return for reevaluation.  If anything worsens and you have shortness of breath, high fever not responding to  medication, weakness, chest pain, worsening cough you should be seen immediately.     ED Prescriptions     Medication Sig Dispense Auth. Provider   promethazine-dextromethorphan (PROMETHAZINE-DM) 6.25-15 MG/5ML syrup Take 5 mLs by mouth 3 (three) times daily as needed for cough. 118 mL Mancel Lardizabal K, PA-C   fluticasone (FLONASE) 50 MCG/ACT nasal spray Place 1 spray into  both nostrils daily. 16 g Iasia Forcier K, PA-C   predniSONE (DELTASONE) 20 MG tablet Take 2 tablets (40 mg total) by mouth daily. 8 tablet Concepcion Kirkpatrick, Noberto Retort, PA-C      PDMP not reviewed this encounter.   Jeani Hawking, PA-C 12/27/21 2009

## 2021-12-27 NOTE — ED Triage Notes (Signed)
Pt is here for runny nose, sneezing, headache, nasal congestion,cough, bilateral ear pain , chills, scratchy throat , 3-4 days

## 2021-12-27 NOTE — Discharge Instructions (Signed)
monitor your MyChart for your COVID results.  We will contact you if you are positive.  Use promethazine DM for cough.  This would make you sleepy so do not drive drink alcohol taking it.  Take prednisone 40 mg for 4 days.  Do not take NSAIDs with this medication including aspirin, ibuprofen/Advil, naproxen/Aleve.  Use Flonase as well as over-the-counter medications such as Tylenol and Mucinex.  Make sure you rest and drink plenty of fluid.  If your symptoms are not improving within a week please return for reevaluation.  If anything worsens and you have shortness of breath, high fever not responding to medication, weakness, chest pain, worsening cough you should be seen immediately.

## 2021-12-28 LAB — SARS CORONAVIRUS 2 (TAT 6-24 HRS): SARS Coronavirus 2: NEGATIVE

## 2022-03-14 ENCOUNTER — Emergency Department (HOSPITAL_COMMUNITY)
Admission: EM | Admit: 2022-03-14 | Discharge: 2022-03-14 | Disposition: A | Discharge status: Home / Self Care | Payer: Medicaid Other | Attending: Emergency Medicine | Admitting: Emergency Medicine

## 2022-03-14 ENCOUNTER — Ambulatory Visit (HOSPITAL_COMMUNITY)
Admission: EM | Admit: 2022-03-14 | Discharge: 2022-03-14 | Disposition: A | Discharge status: Home / Self Care | Payer: Medicaid Other | Attending: Internal Medicine | Admitting: Internal Medicine

## 2022-03-14 ENCOUNTER — Emergency Department (HOSPITAL_COMMUNITY): Payer: Medicaid Other

## 2022-03-14 ENCOUNTER — Encounter (HOSPITAL_COMMUNITY): Payer: Self-pay

## 2022-03-14 DIAGNOSIS — J181 Lobar pneumonia, unspecified organism: Secondary | ICD-10-CM | POA: Diagnosis not present

## 2022-03-14 DIAGNOSIS — R571 Hypovolemic shock: Secondary | ICD-10-CM | POA: Diagnosis not present

## 2022-03-14 DIAGNOSIS — B348 Other viral infections of unspecified site: Secondary | ICD-10-CM

## 2022-03-14 DIAGNOSIS — R509 Fever, unspecified: Secondary | ICD-10-CM | POA: Diagnosis present

## 2022-03-14 DIAGNOSIS — B9781 Human metapneumovirus as the cause of diseases classified elsewhere: Secondary | ICD-10-CM | POA: Diagnosis not present

## 2022-03-14 DIAGNOSIS — Z1152 Encounter for screening for COVID-19: Secondary | ICD-10-CM | POA: Diagnosis not present

## 2022-03-14 DIAGNOSIS — J189 Pneumonia, unspecified organism: Secondary | ICD-10-CM

## 2022-03-14 LAB — I-STAT CHEM 8, ED
BUN: 12 mg/dL (ref 6–20)
Calcium, Ion: 1.1 mmol/L — ABNORMAL LOW (ref 1.15–1.40)
Chloride: 104 mmol/L (ref 98–111)
Creatinine, Ser: 0.9 mg/dL (ref 0.44–1.00)
Glucose, Bld: 100 mg/dL — ABNORMAL HIGH (ref 70–99)
HCT: 39 % (ref 36.0–46.0)
Hemoglobin: 13.3 g/dL (ref 12.0–15.0)
Potassium: 3.8 mmol/L (ref 3.5–5.1)
Sodium: 138 mmol/L (ref 135–145)
TCO2: 23 mmol/L (ref 22–32)

## 2022-03-14 LAB — BASIC METABOLIC PANEL
Anion gap: 6 (ref 5–15)
BUN: 13 mg/dL (ref 6–20)
CO2: 25 mmol/L (ref 22–32)
Calcium: 8.5 mg/dL — ABNORMAL LOW (ref 8.9–10.3)
Chloride: 104 mmol/L (ref 98–111)
Creatinine, Ser: 0.96 mg/dL (ref 0.44–1.00)
GFR, Estimated: >60 mL/min (ref >60)
Glucose, Bld: 99 mg/dL (ref 70–99)
Potassium: 3.7 mmol/L (ref 3.5–5.1)
Sodium: 135 mmol/L (ref 135–145)

## 2022-03-14 LAB — CBC WITH DIFFERENTIAL/PLATELET
Abs Immature Granulocytes: 0.03 10*3/uL (ref 0.00–0.07)
Basophils Absolute: 0 10*3/uL (ref 0.0–0.1)
Basophils Relative: 0 %
Eosinophils Absolute: 0.1 10*3/uL (ref 0.0–0.5)
Eosinophils Relative: 2 %
HCT: 39.6 % (ref 36.0–46.0)
Hemoglobin: 12.3 g/dL (ref 12.0–15.0)
Immature Granulocytes: 1 %
Lymphocytes Relative: 11 %
Lymphs Abs: 0.6 10*3/uL — ABNORMAL LOW (ref 0.7–4.0)
MCH: 22.2 pg — ABNORMAL LOW (ref 26.0–34.0)
MCHC: 31.1 g/dL (ref 30.0–36.0)
MCV: 71.4 fL — ABNORMAL LOW (ref 80.0–100.0)
Monocytes Absolute: 0.7 10*3/uL (ref 0.1–1.0)
Monocytes Relative: 11 %
Neutro Abs: 4.5 10*3/uL (ref 1.7–7.7)
Neutrophils Relative %: 75 %
Platelets: 160 10*3/uL (ref 150–400)
RBC: 5.55 MIL/uL — ABNORMAL HIGH (ref 3.87–5.11)
RDW: 15.3 % (ref 11.5–15.5)
WBC: 6 10*3/uL (ref 4.0–10.5)
nRBC: 0 % (ref 0.0–0.2)

## 2022-03-14 LAB — URINALYSIS, W/ REFLEX TO CULTURE (INFECTION SUSPECTED)
Bilirubin Urine: NEGATIVE
Glucose, UA: NEGATIVE mg/dL
Hgb urine dipstick: NEGATIVE
Ketones, ur: NEGATIVE mg/dL
Leukocytes,Ua: NEGATIVE
Nitrite: NEGATIVE
Protein, ur: NEGATIVE mg/dL
Specific Gravity, Urine: 1.016 (ref 1.005–1.030)
pH: 5 (ref 5.0–8.0)

## 2022-03-14 LAB — RESPIRATORY PANEL BY PCR

## 2022-03-14 LAB — RESP PANEL BY RT-PCR (RSV, FLU A&B, COVID)  RVPGX2
Influenza A by PCR: NEGATIVE
Influenza B by PCR: NEGATIVE
Resp Syncytial Virus by PCR: NEGATIVE
SARS Coronavirus 2 by RT PCR: NEGATIVE

## 2022-03-14 LAB — PROTIME-INR
INR: 1.2 (ref 0.8–1.2)
Prothrombin Time: 14.6 seconds (ref 11.4–15.2)

## 2022-03-14 LAB — I-STAT BETA HCG BLOOD, ED (MC, WL, AP ONLY): I-stat hCG, quantitative: <5 m[IU]/mL (ref <5)

## 2022-03-14 LAB — APTT: aPTT: 32 seconds (ref 24–36)

## 2022-03-14 LAB — LACTIC ACID, PLASMA: Lactic Acid, Venous: 1 mmol/L (ref 0.5–1.9)

## 2022-03-14 MED ORDER — VANCOMYCIN HCL IN DEXTROSE 1-5 GM/200ML-% IV SOLN: 1000.0000 mg | Freq: Once | INTRAVENOUS | Status: DC

## 2022-03-14 MED ORDER — LACTATED RINGERS IV BOLUS (SEPSIS)
2000.0000 mL | Freq: Once | INTRAVENOUS | Status: AC
  Administered 2022-03-14: 2000 mL via INTRAVENOUS

## 2022-03-14 MED ORDER — ACETAMINOPHEN 325 MG PO TABS
650.0000 mg | ORAL_TABLET | Freq: Once | ORAL | Status: AC
  Administered 2022-03-14: 650 mg via ORAL
  Filled 2022-03-14: qty 2

## 2022-03-14 MED ORDER — ACETAMINOPHEN 325 MG PO TABS
ORAL_TABLET | ORAL | Status: AC
  Filled 2022-03-14: qty 2

## 2022-03-14 MED ORDER — METRONIDAZOLE 500 MG/100ML IV SOLN
500.0000 mg | Freq: Once | INTRAVENOUS | Status: AC
  Administered 2022-03-14: 500 mg via INTRAVENOUS
  Filled 2022-03-14: qty 100

## 2022-03-14 MED ORDER — ACETAMINOPHEN 325 MG PO TABS
650.0000 mg | ORAL_TABLET | Freq: Once | ORAL | Status: AC
  Administered 2022-03-14: 650 mg via ORAL

## 2022-03-14 MED ORDER — SODIUM CHLORIDE 0.9 % IV BOLUS
1000.0000 mL | Freq: Once | INTRAVENOUS | Status: AC
  Administered 2022-03-14: 1000 mL via INTRAVENOUS

## 2022-03-14 MED ORDER — IOHEXOL 350 MG/ML SOLN
50.0000 mL | Freq: Once | INTRAVENOUS | Status: AC | PRN
  Administered 2022-03-14: 50 mL via INTRAVENOUS

## 2022-03-14 MED ORDER — AMOXICILLIN 500 MG PO CAPS: 1000.0000 mg | ORAL_CAPSULE | Freq: Once | ORAL | Status: DC

## 2022-03-14 MED ORDER — AMOXICILLIN 500 MG PO CAPS: 1000.0000 mg | ORAL_CAPSULE | Freq: Three times a day (TID) | ORAL | 0 refills | Status: AC

## 2022-03-14 MED ORDER — LACTATED RINGERS IV SOLN: INTRAVENOUS | Status: DC

## 2022-03-14 MED ORDER — VANCOMYCIN HCL 2000 MG/400ML IV SOLN
2000.0000 mg | Freq: Once | INTRAVENOUS | Status: AC
  Administered 2022-03-14: 2000 mg via INTRAVENOUS
  Filled 2022-03-14: qty 400

## 2022-03-14 MED ORDER — SODIUM CHLORIDE 0.9 % IV SOLN
2.0000 g | Freq: Once | INTRAVENOUS | Status: AC
  Administered 2022-03-14: 2 g via INTRAVENOUS
  Filled 2022-03-14: qty 12.5

## 2022-03-14 MED ORDER — BENZONATATE 100 MG PO CAPS: 100.0000 mg | ORAL_CAPSULE | Freq: Three times a day (TID) | ORAL | 0 refills | Status: DC

## 2022-03-14 NOTE — ED Provider Notes (Signed)
[place]    CSN: [id number] Arrival date & time: 03/14/22  1101      History   Chief Complaint Chief Complaint  Patient presents with   Cough   Fever    HPI Linae Mura is a 37 y.o. female comes to the urgent care with 4-day history of loss of taste, loss of appetite and generalized weakness.  Patient says her daughter had similar symptoms a few days prior to the onset of her symptoms.  She developed weakness, cough and a fever of 102.5 Fahrenheit today.  Patient felt very weak and dizzy.  This morning she felt very faint when she walked around.  She came to the urgent care for evaluation.  She denies any shortness of breath or sputum production.  No vomiting or diarrhea.  No chest pain, chest tightness or chest pressure.  No confusion.  No sore throat.  No dysuria urgency or frequency.  No joint aches or generalized bodyaches.  HPI  Past Medical History:  Diagnosis Date   Depression    PCOS (polycystic ovarian syndrome)    Vaginal Pap smear, abnormal    HPV    Patient Active Problem List   Diagnosis Date Noted   Gestational diabetes 06/09/2019   Abnormal glucose tolerance test (GTT) during pregnancy, antepartum 04/08/2019   Status post laparoscopic cholecystectomy Jan 2018 01/18/2016    Past Surgical History:  Procedure Laterality Date   ANKLE FRACTURE SURGERY     CESAREAN SECTION N/A 06/09/2019   Procedure: CESAREAN SECTION;  Surgeon: Christophe Louis, MD;  Location: [facility];  Service: Obstetrics;  Laterality: N/A;   CHOLECYSTECTOMY N/A 01/18/2016   Procedure: LAPAROSCOPIC CHOLECYSTECTOMY WITH INTRAOPERATIVE CHOLANGIOGRAM;  Surgeon: Johnathan Hausen, MD;  Location: [facility];  Service: General;  Laterality: N/A;   WISDOM TOOTH EXTRACTION      OB History     Gravida  1   Para  1   Term  1   Preterm      AB      Living  1      SAB      IAB      Ectopic      Multiple  0   Live Births  1            Home Medications    Prior to  Admission medications   Medication Sig Start Date End Date Taking? Authorizing Provider  ferrous sulfate 325 (65 FE) MG tablet Take 1 tablet (325 mg total) by mouth 2 (two) times daily with a meal. Patient not taking: Reported on 03/14/2022 06/12/19   Suzan Nailer, CNM  NIFEdipine (ADALAT CC) 30 MG 24 hr tablet Take 1 tablet (30 mg total) by mouth daily. Patient not taking: Reported on 12/19/2020 06/13/19   Suzan Nailer, CNM  oxyCODONE (OXY IR/ROXICODONE) 5 MG immediate release tablet Take 1 tablet (5 mg total) by mouth every 6 (six) hours as needed for moderate pain. Patient not taking: Reported on 12/19/2020 06/12/19   Suzan Nailer, CNM  promethazine-dextromethorphan (PROMETHAZINE-DM) 6.25-15 MG/5ML syrup Take 5 mLs by mouth 3 (three) times daily as needed for cough. Patient not taking: Reported on 03/14/2022 12/27/21   Raspet, Junie Panning K, PA-C  sertraline (ZOLOFT) 50 MG tablet Take 1 tablet (50 mg total) by mouth daily. Patient not taking: Reported on 12/19/2020 06/13/19   Suzan Nailer, CNM    Family History Family History  Problem Relation Age of Onset   Stroke Mother  Hypertension Mother    Diabetes Mother    Kidney disease Mother    Hypertension Father    Diabetes Father    Hypertension Brother    Diabetes Brother    Sarcoidosis Brother    Hypertension Maternal Grandmother    Diabetes Maternal Grandmother    Breast cancer Maternal Grandmother    Hypertension Maternal Grandfather     Social History Social History   Tobacco Use   Smoking status: Never   Smokeless tobacco: Never  Vaping Use   Vaping Use: Never used  Substance Use Topics   Alcohol use: Not Currently    Comment: occasional/ couple times year   Drug use: No     Allergies   Patient has no known allergies.   Review of Systems Review of Systems As per HPI  Physical Exam Triage Vital Signs ED Triage Vitals  Enc Vitals Group     BP 03/14/22 1246 (!) 76/61     Pulse Rate 03/14/22 1242 (!) 103      Resp 03/14/22 1242 18     Temp 03/14/22 1242 (!) 102.5 F (39.2 C)     Temp Source 03/14/22 1242 Oral     SpO2 03/14/22 1242 97 %     Weight --      Height --      Head Circumference --      Peak Flow --      Pain Score 03/14/22 1243 0     Pain Loc --      Pain Edu? --      Excl. in [place]? --    No data found.  Updated Vital Signs BP 103/61 (BP Location: Left Arm)   Pulse (!) 103   Temp (!) 102.5 F (39.2 C) (Oral)   Resp 18   LMP 02/19/2022   SpO2 97%   Breastfeeding No   Visual Acuity Right Eye Distance:   Left Eye Distance:   Bilateral Distance:    Right Eye Near:   Left Eye Near:    Bilateral Near:     Physical Exam Vitals and nursing note reviewed.  Constitutional:      Appearance: She is ill-appearing and toxic-appearing.  Cardiovascular:     Rate and Rhythm: Regular rhythm. Tachycardia present.     Heart sounds: Normal heart sounds.  Pulmonary:     Effort: Pulmonary effort is normal.     Breath sounds: Normal breath sounds. No wheezing or rhonchi.  Abdominal:     General: Bowel sounds are normal.     Palpations: Abdomen is soft.  Skin:    General: Skin is warm.     Capillary Refill: Capillary refill takes less than 2 seconds.  Neurological:     Mental Status: She is alert.      UC Treatments / Results  Labs (all labs ordered are listed, but only abnormal results are displayed) Labs Reviewed - No data to display  EKG   Radiology DG Chest 1 View  Result Date: 03/14/2022 CLINICAL DATA:  High fever EXAM: CHEST  1 VIEW COMPARISON:  None Available. FINDINGS: Low lung volumes. Normal cardiac and mediastinal contours. No focal airspace opacity. No radiographically apparent displaced rib fractures. There is a subdiaphragmatic lucency on the left, which is nonspecific, and favored to represent gastric gaseous distention. If there is clinical concern for abdominal pathology further evaluation with an abdominal radiograph is recommended. IMPRESSION: 1. Low  lung volumes.  No focal airspace opacity. 2. There is a subdiaphragmatic lucency  on the left, which is nonspecific, and favored to represent gastric gaseous distention. If there is clinical concern for abdominal pathology further evaluation with an abdominal radiograph is recommended. Electronically Signed   By: Marin Roberts M.D.   On: 03/14/2022 14:15    Procedures Procedures (including critical care time)  Medications Ordered in UC Medications  acetaminophen (TYLENOL) tablet 650 mg (650 mg Oral Given 03/14/22 1248)  sodium chloride 0.9 % bolus 1,000 mL (1,000 mLs Intravenous New Bag/Given 03/14/22 1314)    Initial Impression / Assessment and Plan / UC Course  I have reviewed the triage vital signs and the nursing notes.  Pertinent labs & imaging results that were available during my care of the patient were reviewed by me and considered in my medical decision making (see chart for details).     1.  Hypovolemia with fever: Suspect septic shock.  Patient's initial blood pressure was 76/61 with a heart rate of 103 and a temperature of 102.5 Fahrenheit. IV fluid bolus was started immediately.  IV fluid bolus running over 37 minutes Patient was advised to go to the emergency department.  [facility] was called and the currently present in the room to transport patient to the emergency department.  Patient will need hospitalization for further evaluation and management. Final Clinical Impressions(s) / UC Diagnoses   Final diagnoses:  Hypovolemic shock [facility])   Discharge Instructions   None    ED Prescriptions   None    PDMP not reviewed this encounter.   Chase Picket, MD 03/14/22 [phone]

## 2022-03-14 NOTE — Sepsis Progress Note (Signed)
Sepsis protocol monitored by [facility] ?

## 2022-03-14 NOTE — ED Notes (Signed)
Made Lexine Baton, Agricultural consultant at [facility] aware that patient coming via [employer] 

## 2022-03-14 NOTE — ED Notes (Signed)
Pt report received from previous nurse. Pt A&O x4, vitals stable, denies needs/complaints. Call bell in reach. No acute distress noted.

## 2022-03-14 NOTE — ED Triage Notes (Signed)
Pt BIB [facility] from UC d/t hypotension, [place] reported BP of 70 systolic, [facility] has [id number]. Patient reports metallic taste in mouth, weakness, fever since Tuesday. Fever 102.5, UC admin '650mg'$  tylenol. 39 yo daughter has same symptoms w/ cough since Monday. A&Ox4, no distress noted.

## 2022-03-14 NOTE — ED Provider Notes (Signed)
Care assumed from PA Lauren Autry at shift change, please see her note for full details but in brief, Kathy Mason is a 37 y.o. female who presents from urgent care for evaluation of fever and hypotension.  Patient started feeling poorly on Tuesday she reports associated fatigue, fever and productive cough.  Her daughter was sick with similar symptoms and was seen and told she had a viral infection.  She went to urgent care and there was noted to be hypotensive into the 70s so EMS was called to bring her to the ED.  She has had decreased oral intake but no vomiting or diarrhea, denies any abdominal pain, denies dysuria or urinary frequency.  Denies chest pain or shortness of breath.  Reports feeling lightheaded over the past 2 days but without syncopal episodes.   Physical Exam  BP 114/71   Pulse 84   Temp 99.2 F (37.3 C) (Oral)   Resp 16   LMP 03/04/2022   SpO2 99%   Physical Exam Vitals and nursing note reviewed.  Constitutional:      General: She is not in acute distress.    Appearance: Normal appearance. She is well-developed. She is not ill-appearing or diaphoretic.  HENT:     Head: Normocephalic and atraumatic.  Eyes:     General:        Right eye: No discharge.        Left eye: No discharge.  Pulmonary:     Effort: Pulmonary effort is normal. No respiratory distress.  Neurological:     Mental Status: She is alert and oriented to person, place, and time.     Coordination: Coordination normal.  Psychiatric:        Mood and Affect: Mood normal.        Behavior: Behavior normal.     Procedures  Procedures  ED Course / MDM   Labs Reviewed  RESPIRATORY PANEL BY PCR - Abnormal; Notable for the following components:      Result Value   Metapneumovirus DETECTED (*)    All other components within normal limits  CBC WITH DIFFERENTIAL/PLATELET - Abnormal; Notable for the following components:   RBC 5.55 (*)    MCV 71.4 (*)    MCH 22.2 (*)    Lymphs Abs 0.6 (*)     All other components within normal limits  URINALYSIS, W/ REFLEX TO CULTURE (INFECTION SUSPECTED) - Abnormal; Notable for the following components:   APPearance HAZY (*)    Bacteria, UA RARE (*)    All other components within normal limits  I-STAT CHEM 8, ED - Abnormal; Notable for the following components:   Glucose, Bld 100 (*)    Calcium, Ion 1.10 (*)    All other components within normal limits  RESP PANEL BY RT-PCR (RSV, FLU A&B, COVID)  [id number]  CULTURE, BLOOD (ROUTINE X 2)  CULTURE, BLOOD (ROUTINE X 2)  LACTIC ACID, PLASMA  PROTIME-INR  APTT  LACTIC ACID, PLASMA  BASIC METABOLIC PANEL  I-STAT BETA HCG BLOOD, ED ([facility], [facility], [facility] ONLY)    DG Chest 1 View  Result Date: 03/14/2022 CLINICAL DATA:  High fever EXAM: CHEST  1 VIEW COMPARISON:  None Available. FINDINGS: Low lung volumes. Normal cardiac and mediastinal contours. No focal airspace opacity. No radiographically apparent displaced rib fractures. There is a subdiaphragmatic lucency on the left, which is nonspecific, and favored to represent gastric gaseous distention. If there is clinical concern for abdominal pathology further evaluation with an abdominal radiograph is  recommended. IMPRESSION: 1. Low lung volumes.  No focal airspace opacity. 2. There is a subdiaphragmatic lucency on the left, which is nonspecific, and favored to represent gastric gaseous distention. If there is clinical concern for abdominal pathology further evaluation with an abdominal radiograph is recommended. Electronically Signed   By: Marin Roberts M.D.   On: 03/14/2022 14:15    Medical Decision Making Amount and/or Complexity of Data Reviewed Labs: ordered. Radiology: ordered. ECG/medicine tests: ordered.  Risk OTC drugs. Prescription drug management.   Despite low blood pressure documented at urgent care in the setting of fever patient has not exhibited any tachycardia or hypoxia and her lab evaluation has overall been largely reassuring.  Labs are  without leukocytosis or anemia, no significant electrolyte derangements and normal renal function, normal lactic acid, coags normal, urinalysis without signs of infection, initial viral testing negative for COVID flu and RSV but patient did have recent sick contact with her daughter, high clinical suspicion for other potential viral infection.  Given patient's ongoing productive cough with negative chest x-ray feel it is reasonable to proceed with CT of the chest to assess for pneumonia.  Also sent expanded respiratory viral panel.  Patient's blood pressures have remained stable here in the ED with no additional episodes of hypotension and patient has been ambulatory in the department.  Fevers resolved with Tylenol.  Expanded RVP is positive for human metapneumovirus which I suspect is causing the majority of her symptoms but still feel that is reasonable to check for pneumonia.  At shift change care signed out to Dr. Maylon Peppers who will follow-up on pending CT of the chest, if evidence of pneumonia will prescribe antibiotics but otherwise will recommend continued supportive care with increased fluid intake for viral infection.       Jacqlyn Larsen, PA-C 03/14/22 [place], [place] K, DO 03/14/22 2037

## 2022-03-14 NOTE — ED Provider Notes (Signed)
[facility] Provider Note   CSN: [id number] Arrival date & time: 03/14/22  1332     History  Chief Complaint  Patient presents with   Fever    Kathy Mason is a 37 y.o. female.  With past medical history of PCOS who presents to the emergency department from urgent care for hypotension.  States that symptoms began Tuesday.  She describes feeling just profoundly fatigued.  She states that she has had fever yesterday and today.  She describes having cough beginning at the beginning of this week and her daughter at home has similar symptoms.  She has been taking over the counter Mucinex and Aleve with no improvement in her symptoms. She is also is reporting metallic taste in her mouth.  She went to urgent care and they noted her to be hypotensive in the 70s so they called EMS to send her to the emergency department.  She is denying any nausea, vomiting or diarrhea or abdominal pain or dysuria.  She denies having chest pain or shortness of breath.  HPI     Home Medications Prior to Admission medications   Medication Sig Start Date End Date Taking? Authorizing Provider  ferrous sulfate 325 (65 FE) MG tablet Take 1 tablet (325 mg total) by mouth 2 (two) times daily with a meal. Patient not taking: Reported on 03/14/2022 06/12/19   Suzan Nailer, CNM  NIFEdipine (ADALAT CC) 30 MG 24 hr tablet Take 1 tablet (30 mg total) by mouth daily. Patient not taking: Reported on 12/19/2020 06/13/19   Suzan Nailer, CNM  oxyCODONE (OXY IR/ROXICODONE) 5 MG immediate release tablet Take 1 tablet (5 mg total) by mouth every 6 (six) hours as needed for moderate pain. Patient not taking: Reported on 12/19/2020 06/12/19   Suzan Nailer, CNM  promethazine-dextromethorphan (PROMETHAZINE-DM) 6.25-15 MG/5ML syrup Take 5 mLs by mouth 3 (three) times daily as needed for cough. Patient not taking: Reported on 03/14/2022 12/27/21   Raspet, Junie Panning K, PA-C  sertraline  (ZOLOFT) 50 MG tablet Take 1 tablet (50 mg total) by mouth daily. Patient not taking: Reported on 12/19/2020 06/13/19   Suzan Nailer, CNM      Allergies    Patient has no known allergies.    Review of Systems   Review of Systems  Constitutional:  Positive for fatigue and fever.  All other systems reviewed and are negative.   Physical Exam Updated Vital Signs BP 110/68   Pulse 98   Temp (!) 101.2 F (38.4 C) (Oral)   Resp 19   LMP 03/04/2022   SpO2 100%  Physical Exam Vitals and nursing note reviewed.  Constitutional:      General: She is in acute distress.     Appearance: She is obese. She is ill-appearing and toxic-appearing.  HENT:     Head: Normocephalic.     Mouth/Throat:     Mouth: Mucous membranes are dry.  Eyes:     General: No scleral icterus.    Extraocular Movements: Extraocular movements intact.  Cardiovascular:     Rate and Rhythm: Normal rate and regular rhythm.     Pulses: Normal pulses.     Heart sounds: No murmur heard. Pulmonary:     Effort: Pulmonary effort is normal. No respiratory distress.     Breath sounds: Normal breath sounds.     Comments: Decreased lung sounds in the bases secondary to habitus Abdominal:     General: Bowel sounds  are normal. There is no distension.     Palpations: Abdomen is soft.     Tenderness: There is no abdominal tenderness.  Musculoskeletal:     Cervical back: Neck supple.  Skin:    General: Skin is warm and dry.     Capillary Refill: Capillary refill takes less than 2 seconds.  Neurological:     Mental Status: She is alert and oriented to person, place, and time.     Comments: Globally weak  Patient appears somewhat encephalopathic. She is drowsy appearing and staring off at times during exam   Psychiatric:        Behavior: Behavior is slowed. Behavior is cooperative.     ED Results / Procedures / Treatments   Labs (all labs ordered are listed, but only abnormal results are displayed) Labs Reviewed   RESP PANEL BY RT-PCR (RSV, FLU A&B, COVID)  RVPGX2  CULTURE, BLOOD (ROUTINE X 2)  CULTURE, BLOOD (ROUTINE X 2)  LACTIC ACID, PLASMA  LACTIC ACID, PLASMA  COMPREHENSIVE METABOLIC PANEL  CBC WITH DIFFERENTIAL/PLATELET  PROTIME-INR  APTT  URINALYSIS, W/ REFLEX TO CULTURE (INFECTION SUSPECTED)  I-STAT BETA HCG BLOOD, ED ([facility], [facility], [facility] ONLY)    EKG None  Radiology DG Chest 1 View  Result Date: 03/14/2022 CLINICAL DATA:  High fever EXAM: CHEST  1 VIEW COMPARISON:  None Available. FINDINGS: Low lung volumes. Normal cardiac and mediastinal contours. No focal airspace opacity. No radiographically apparent displaced rib fractures. There is a subdiaphragmatic lucency on the left, which is nonspecific, and favored to represent gastric gaseous distention. If there is clinical concern for abdominal pathology further evaluation with an abdominal radiograph is recommended. IMPRESSION: 1. Low lung volumes.  No focal airspace opacity. 2. There is a subdiaphragmatic lucency on the left, which is nonspecific, and favored to represent gastric gaseous distention. If there is clinical concern for abdominal pathology further evaluation with an abdominal radiograph is recommended. Electronically Signed   By: Marin Roberts M.D.   On: 03/14/2022 14:15    Procedures Procedures   Medications Ordered in ED Medications  lactated ringers infusion (has no administration in time range)  lactated ringers bolus 2,000 mL (2,000 mLs Intravenous New Bag/Given 03/14/22 1506)  ceFEPIme (MAXIPIME) 2 g in sodium chloride 0.9 % 100 mL IVPB (2 g Intravenous New Bag/Given 03/14/22 1456)  metroNIDAZOLE (FLAGYL) IVPB 500 mg (500 mg Intravenous New Bag/Given 03/14/22 1510)  vancomycin (VANCOREADY) IVPB 2000 mg/400 mL (has no administration in time range)  acetaminophen (TYLENOL) tablet 650 mg (has no administration in time range)    ED Course/ Medical Decision Making/ A&P    Medical Decision Making Amount and/or Complexity of Data  Reviewed Labs: ordered. Radiology: ordered. ECG/medicine tests: ordered.  Risk OTC drugs. Prescription drug management.   1345: Initial exam very concerning for sepsis.  She is somewhat inattentive on exam and kind of drowsy.  She is hypotensive to the 70s.  I have asked charge to place her into a room rather than a hallway bed so that we can have closer monitoring.  She received 1 L IV fluids with EMS and I am starting 2 more liters now which should cover for ideal body weight.  Will give her broad-spectrum antibiotics although I anticipate that she has likely pneumonia or respiratory source.  Her chest x-ray does not show any obvious pneumonia.  I am still waiting on her respiratory panel.  She has not had any abdominal pain, nausea vomiting or diarrhea.  She is nontender  on my exam.  Will keep her on broad-spectrum antibiotics at this time.  Likely will need admission given her hypotension.  Continues to have soft pressures with IV fluids.  Do not feel that she is meningitic or requiring LP at this time.  Care of patient is being handed off to Benedetto Goad, PA-C for continuation of care.  Please see her note. Final Clinical Impression(s) / ED Diagnoses Final diagnoses:  None    Rx / DC Orders ED Discharge Orders     None         Mickie Hillier, PA-C 03/14/22 1527    Davonna Belling, MD 03/15/22 1453

## 2022-03-14 NOTE — ED Triage Notes (Signed)
Pt c/o cough, congestion, weak, loss of appetite and loss of taste since Monday. Taking OTC meds with no relief.

## 2022-03-14 NOTE — ED Notes (Signed)
Patient transported to x-ray. ?

## 2022-03-14 NOTE — ED Notes (Signed)
Patient is being discharged from the Urgent Care and sent to the Emergency Department via [facility] . Per Dr. Lanny Cramp , patient is in need of higher level of care due to hypotension and fever. Patient is aware and verbalizes understanding of plan of care.  Vitals:   03/14/22 1246 03/14/22 1308  BP: (!) 76/61 103/61  Pulse:    Resp:    Temp:    SpO2:

## 2022-03-14 NOTE — Discharge Instructions (Addendum)
You were seen in the emergency department for your shortness of breath tested positive for human metapneumovirus which is a respiratory virus, however your chest CT scan reveals that you started to develop a pneumonia as well.  Given you prescription for antibiotics he should complete this as prescribed.  Have also given you some cough medicine that you can take as well as trying raw honey and hot water or tea and using a humidifier at night.  Your chest CT also incidentally showed enlargement of your thymus which is a organ in your chest.  This may be benign but you should have this followed up by your primary doctor.  You should return to the emergency department if you are having fevers despite the antibiotics, worsening shortness of breath or any other new or concerning symptoms.

## 2022-03-19 LAB — CULTURE, BLOOD (ROUTINE X 2)
Culture: NO GROWTH
Culture: NO GROWTH

## 2022-04-15 ENCOUNTER — Encounter (HOSPITAL_COMMUNITY): Payer: Self-pay

## 2022-04-15 ENCOUNTER — Ambulatory Visit (HOSPITAL_COMMUNITY)
Admission: EM | Admit: 2022-04-15 | Discharge: 2022-04-15 | Disposition: A | Discharge status: Home / Self Care | Payer: Medicaid Other | Attending: Emergency Medicine | Admitting: Emergency Medicine

## 2022-04-15 DIAGNOSIS — J101 Influenza due to other identified influenza virus with other respiratory manifestations: Secondary | ICD-10-CM

## 2022-04-15 LAB — POC INFLUENZA A AND B ANTIGEN (URGENT CARE ONLY)
INFLUENZA A ANTIGEN, POC: NEGATIVE
INFLUENZA B ANTIGEN, POC: POSITIVE — AB

## 2022-04-15 MED ORDER — OSELTAMIVIR PHOSPHATE 75 MG PO CAPS: 75.0000 mg | ORAL_CAPSULE | Freq: Two times a day (BID) | ORAL | 0 refills | Status: AC

## 2022-04-15 MED ORDER — IBUPROFEN 800 MG PO TABS: 800.0000 mg | ORAL_TABLET | Freq: Three times a day (TID) | ORAL | 0 refills | Status: AC

## 2022-04-15 MED ORDER — PSEUDOEPHEDRINE HCL ER 120 MG PO TB12: 120.0000 mg | ORAL_TABLET | Freq: Two times a day (BID) | ORAL | 0 refills | Status: AC

## 2022-04-15 NOTE — Discharge Instructions (Signed)
Positive for influenza B which is a virus and should steadily improve in time it can take up to 7 to 10 days before you truly start to see a turnaround however things will get better  Given Tamiflu every morning and every evening for 5 days, this medicine reduces the amount of virus in your body Help to calm your symptoms as well as to the timeline that you are sick  For fever alternate ibuprofen and Tylenol to help keep this from spiking up  In addition to Mucinex you may use Sudafed during every morning and every evening to further help reduce congestion   For cough: honey 1/2 to 1 teaspoon (you can dilute the honey in water or another fluid).  You can also use guaifenesin and dextromethorphan for cough. You can use a humidifier for chest congestion and cough.  If you don't have a humidifier, you can sit in the bathroom with the hot shower running.      For sore throat: try warm salt water gargles, cepacol lozenges, throat spray, warm tea or water with lemon/honey, popsicles or ice, or OTC cold relief medicine for throat discomfort.   For congestion: take a daily anti-histamine like Zyrtec, Claritin, and a oral decongestant, such as pseudoephedrine.  You can also use Flonase 1-2 sprays in each nostril daily.   It is important to stay hydrated: drink plenty of fluids (water, gatorade/powerade/pedialyte, juices, or teas) to keep your throat moisturized and help further relieve irritation/discomfort.

## 2022-04-15 NOTE — ED Triage Notes (Addendum)
Pt is here for fever, nasal congestion, runny nose, cough, pt has been using tylenol, Mucinex  X 3DAYS . Tylenol taken @8 :30am

## 2022-04-15 NOTE — ED Provider Notes (Signed)
[facility]    CSN: [id number] Arrival date & time: 04/15/22  1005      History   Chief Complaint Chief Complaint  Patient presents with   Fever   Cough    HPI Kathy Mason is a 37 y.o. female.   Patient presents for evaluation of fever, nasal congestion, rhinorrhea and a productive cough with yellow sputum for 3 days.  Throat is itchy but denies pain.  Fever peaking at 100, endorses difficulty breaking the fever, has been using Tylenol for management.  Tolerating food and liquids.  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  Denies shortness of breath or wheezing.  Recent treatment for pneumonia on 03/14/2022.    Past Medical History:  Diagnosis Date   Depression    PCOS (polycystic ovarian syndrome)    Vaginal Pap smear, abnormal    HPV    Patient Active Problem List   Diagnosis Date Noted   Gestational diabetes 06/09/2019   Abnormal glucose tolerance test (GTT) during pregnancy, antepartum 04/08/2019   Status post laparoscopic cholecystectomy Jan 2018 01/18/2016    Past Surgical History:  Procedure Laterality Date   ANKLE FRACTURE SURGERY     CESAREAN SECTION N/A 06/09/2019   Procedure: CESAREAN SECTION;  Surgeon: Gerald Leitz, MD;  Location: [facility];  Service: Obstetrics;  Laterality: N/A;   CHOLECYSTECTOMY N/A 01/18/2016   Procedure: LAPAROSCOPIC CHOLECYSTECTOMY WITH INTRAOPERATIVE CHOLANGIOGRAM;  Surgeon: Luretha Murphy, MD;  Location: [facility];  Service: General;  Laterality: N/A;   WISDOM TOOTH EXTRACTION      OB History     Gravida  1   Para  1   Term  1   Preterm      AB      Living  1      SAB      IAB      Ectopic      Multiple  0   Live Births  1            Home Medications    Prior to Admission medications   Medication Sig Start Date End Date Taking? Authorizing Provider  benzonatate (TESSALON) 100 MG capsule Take 1 capsule (100 mg total) by mouth every 8 (eight) hours. 03/14/22    Elayne Snare K, DO  ferrous sulfate 325 (65 FE) MG tablet Take 1 tablet (325 mg total) by mouth 2 (two) times daily with a meal. Patient not taking: Reported on 03/14/2022 06/12/19   June Leap, CNM  NIFEdipine (ADALAT CC) 30 MG 24 hr tablet Take 1 tablet (30 mg total) by mouth daily. Patient not taking: Reported on 12/19/2020 06/13/19   June Leap, CNM  oxyCODONE (OXY IR/ROXICODONE) 5 MG immediate release tablet Take 1 tablet (5 mg total) by mouth every 6 (six) hours as needed for moderate pain. Patient not taking: Reported on 12/19/2020 06/12/19   June Leap, CNM  promethazine-dextromethorphan (PROMETHAZINE-DM) 6.25-15 MG/5ML syrup Take 5 mLs by mouth 3 (three) times daily as needed for cough. Patient not taking: Reported on 03/14/2022 12/27/21   Raspet, Denny Peon K, PA-C  sertraline (ZOLOFT) 50 MG tablet Take 1 tablet (50 mg total) by mouth daily. Patient not taking: Reported on 12/19/2020 06/13/19   June Leap, CNM    Family History Family History  Problem Relation Age of Onset   Stroke Mother    Hypertension Mother    Diabetes Mother    Kidney disease Mother  Hypertension Father    Diabetes Father    Hypertension Brother    Diabetes Brother    Sarcoidosis Brother    Hypertension Maternal Grandmother    Diabetes Maternal Grandmother    Breast cancer Maternal Grandmother    Hypertension Maternal Grandfather     Social History Social History   Tobacco Use   Smoking status: Never   Smokeless tobacco: Never  Vaping Use   Vaping Use: Never used  Substance Use Topics   Alcohol use: Not Currently    Comment: occasional/ couple times year   Drug use: No     Allergies   Patient has no known allergies.   Review of Systems Review of Systems  Constitutional:  Positive for fever. Negative for activity change, appetite change, chills, diaphoresis, fatigue and unexpected weight change.  HENT:  Positive for congestion and rhinorrhea. Negative for dental problem,  drooling, ear discharge, ear pain, facial swelling, hearing loss, mouth sores, nosebleeds, postnasal drip, sinus pressure, sinus pain, sneezing, sore throat, tinnitus, trouble swallowing and voice change.   Respiratory:  Positive for cough. Negative for apnea, choking, chest tightness, shortness of breath, wheezing and stridor.   Cardiovascular: Negative.   Gastrointestinal: Negative.   Skin: Negative.   Neurological: Negative.      Physical Exam Triage Vital Signs ED Triage Vitals  Enc Vitals Group     BP 04/15/22 1140 117/74     Pulse Rate 04/15/22 1140 96     Resp 04/15/22 1140 12     Temp 04/15/22 1140 98 F (36.7 C)     Temp Source 04/15/22 1140 Oral     SpO2 04/15/22 1140 94 %     Weight --      Height --      Head Circumference --      Peak Flow --      Pain Score 04/15/22 1137 0     Pain Loc --      Pain Edu? --      Excl. in GC? --    No data found.  Updated Vital Signs BP 117/74 (BP Location: Right Arm)   Pulse 96   Temp 98 F (36.7 C) (Oral)   Resp 12   SpO2 94%   Visual Acuity Right Eye Distance:   Left Eye Distance:   Bilateral Distance:    Right Eye Near:   Left Eye Near:    Bilateral Near:     Physical Exam Constitutional:      Appearance: Normal appearance.  HENT:     Head: Normocephalic.     Right Ear: Tympanic membrane, ear canal and external ear normal.     Left Ear: Tympanic membrane, ear canal and external ear normal.     Nose: Congestion present.     Mouth/Throat:     Pharynx: Posterior oropharyngeal erythema present.  Cardiovascular:     Rate and Rhythm: Normal rate and regular rhythm.     Pulses: Normal pulses.     Heart sounds: Normal heart sounds.  Pulmonary:     Effort: Pulmonary effort is normal.     Breath sounds: Normal breath sounds.  Skin:    General: Skin is warm and dry.  Neurological:     Mental Status: She is alert and oriented to person, place, and time. Mental status is at baseline.  Psychiatric:        Mood  and Affect: Mood normal.        Behavior: Behavior normal.  UC Treatments / Results  Labs (all labs ordered are listed, but only abnormal results are displayed) Labs Reviewed - No data to display  EKG   Radiology No results found.  Procedures Procedures (including critical care time)  Medications Ordered in UC Medications - No data to display  Initial Impression / Assessment and Plan / UC Course  I have reviewed the triage vital signs and the nursing notes.  Pertinent labs & imaging results that were available during my care of the patient were reviewed by me and considered in my medical decision making (see chart for details).   Influenza B  Vital signs stable patient is in no signs of distress nontoxic-appearing, lungs are clear to auscultation O2 saturation 94% on room air, low suspicion for pneumonia or bronchitis therefore imaging deferred, confirmed by rapid testing, discussed with patient, Tamiflu prescribed as well as ibuprofen and pseudoephedrine, recommended supportive measures, additionally may use over-the-counter medications for treatment with follow-up with urgent care as needed, work note given Final Clinical Impressions(s) / UC Diagnoses   Final diagnoses:  None   Discharge Instructions   None    ED Prescriptions   None    PDMP not reviewed this encounter.   Valinda HoarWhite, Denzil Bristol R, NP 04/15/22 1245

## 2022-08-23 ENCOUNTER — Other Ambulatory Visit: Payer: Self-pay

## 2022-08-23 ENCOUNTER — Encounter (HOSPITAL_COMMUNITY): Payer: Self-pay | Admitting: *Deleted

## 2022-08-23 ENCOUNTER — Ambulatory Visit (HOSPITAL_COMMUNITY)
Admission: EM | Admit: 2022-08-23 | Discharge: 2022-08-23 | Disposition: A | Discharge status: Home / Self Care | Payer: Medicaid Other | Attending: Nurse Practitioner | Admitting: Nurse Practitioner

## 2022-08-23 DIAGNOSIS — J069 Acute upper respiratory infection, unspecified: Secondary | ICD-10-CM | POA: Diagnosis not present

## 2022-08-23 DIAGNOSIS — Z1152 Encounter for screening for COVID-19: Secondary | ICD-10-CM | POA: Insufficient documentation

## 2022-08-23 MED ORDER — BENZONATATE 100 MG PO CAPS: 100.0000 mg | ORAL_CAPSULE | Freq: Three times a day (TID) | ORAL | 0 refills | Status: AC | PRN

## 2022-08-23 MED ORDER — PROMETHAZINE-DM 6.25-15 MG/5ML PO SYRP: 5.0000 mL | ORAL_SOLUTION | Freq: Every evening | ORAL | 0 refills | Status: AC | PRN

## 2022-08-23 NOTE — ED Triage Notes (Signed)
Pt reports since Monday she has had a cough ,sore throat, HA and eye drainage.

## 2022-08-23 NOTE — ED Provider Notes (Signed)
[facility]    CSN: [id number] Arrival date & time: 08/23/22  1050      History   Chief Complaint Chief Complaint  Patient presents with   Cough   Headache   Eye Drainage   Shortness of Breath    HPI Kathy Mason is a 37 y.o. female.   Patient presents today with 5-day history of nonproductive cough, stuffy nose, runny nose, sore throat, sinus pressure, and headache.  She reports runny nose, sore throat, sinus pressure, and headache have improved after use of ibuprofen.  No known fevers, body aches or chills.  No shortness of breath or chest pain, ear pain, abdominal pain, nausea/vomiting, diarrhea, change in appetite, change in energy levels, or weakness/dizziness.  Reports daughter has been sick for 3 weeks with similar symptoms and she thinks she may have caught it from her.  Took leftover Tessalon Perles for the cough which did seem to help as well as the ibuprofen.    Past Medical History:  Diagnosis Date   Depression    PCOS (polycystic ovarian syndrome)    Vaginal Pap smear, abnormal    HPV    Patient Active Problem List   Diagnosis Date Noted   Gestational diabetes 06/09/2019   Abnormal glucose tolerance test (GTT) during pregnancy, antepartum 04/08/2019   Status post laparoscopic cholecystectomy Jan 2018 01/18/2016    Past Surgical History:  Procedure Laterality Date   ANKLE FRACTURE SURGERY     CESAREAN SECTION N/A 06/09/2019   Procedure: CESAREAN SECTION;  Surgeon: Gerald Leitz, MD;  Location: [facility];  Service: Obstetrics;  Laterality: N/A;   CHOLECYSTECTOMY N/A 01/18/2016   Procedure: LAPAROSCOPIC CHOLECYSTECTOMY WITH INTRAOPERATIVE CHOLANGIOGRAM;  Surgeon: Luretha Murphy, MD;  Location: [facility];  Service: General;  Laterality: N/A;   WISDOM TOOTH EXTRACTION      OB History     Gravida  1   Para  1   Term  1   Preterm      AB      Living  1      SAB      IAB      Ectopic      Multiple  0   Live Births  1             Home Medications    Prior to Admission medications   Medication Sig Start Date End Date Taking? Authorizing Provider  ibuprofen (ADVIL) 800 MG tablet Take 1 tablet (800 mg total) by mouth 3 (three) times daily. 04/15/22  Yes White, Adrienne R, NP  benzonatate (TESSALON) 100 MG capsule Take 1 capsule (100 mg total) by mouth 3 (three) times daily as needed for cough. Do not take with alcohol or while driving or operating heavy machinery.  May cause drowsiness. 08/23/22   Valentino Nose, NP  ferrous sulfate 325 (65 FE) MG tablet Take 1 tablet (325 mg total) by mouth 2 (two) times daily with a meal. Patient not taking: Reported on 03/14/2022 06/12/19   June Leap, CNM  NIFEdipine (ADALAT CC) 30 MG 24 hr tablet Take 1 tablet (30 mg total) by mouth daily. Patient not taking: Reported on 12/19/2020 06/13/19   June Leap, CNM  oseltamivir (TAMIFLU) 75 MG capsule Take 1 capsule (75 mg total) by mouth every 12 (twelve) hours. 04/15/22   Valinda Hoar, NP  promethazine-dextromethorphan (PROMETHAZINE-DM) 6.25-15 MG/5ML syrup Take 5 mLs by mouth at bedtime as needed for cough. Do not take with alcohol  or while driving or operating heavy machinery.  May cause drowsiness. 08/23/22   Valentino Nose, NP  pseudoephedrine (SUDAFED) 120 MG 12 hr tablet Take 1 tablet (120 mg total) by mouth 2 (two) times daily. 04/15/22   Valinda Hoar, NP  sertraline (ZOLOFT) 50 MG tablet Take 1 tablet (50 mg total) by mouth daily. Patient not taking: Reported on 12/19/2020 06/13/19   June Leap, CNM    Family History Family History  Problem Relation Age of Onset   Stroke Mother    Hypertension Mother    Diabetes Mother    Kidney disease Mother    Hypertension Father    Diabetes Father    Hypertension Brother    Diabetes Brother    Sarcoidosis Brother    Hypertension Maternal Grandmother    Diabetes Maternal Grandmother    Breast cancer Maternal Grandmother    Hypertension Maternal Grandfather      Social History Social History   Tobacco Use   Smoking status: Never   Smokeless tobacco: Never  Vaping Use   Vaping status: Never Used  Substance Use Topics   Alcohol use: Not Currently    Comment: occasional/ couple times year   Drug use: No     Allergies   Patient has no known allergies.   Review of Systems Review of Systems Per HPI  Physical Exam Triage Vital Signs ED Triage Vitals [08/23/22 1128]  Encounter Vitals Group     BP 123/80     Systolic BP Percentile      Diastolic BP Percentile      Pulse Rate 73     Resp 20     Temp 98.6 F (37 C)     Temp src      SpO2 93 %     Weight      Height      Head Circumference      Peak Flow      Pain Score      Pain Loc      Pain Education      Exclude from Growth Chart    No data found.  Updated Vital Signs BP 123/80   Pulse 73   Temp 98.6 F (37 C)   Resp 20   LMP 07/29/2022   SpO2 93%   Visual Acuity Right Eye Distance:   Left Eye Distance:   Bilateral Distance:    Right Eye Near:   Left Eye Near:    Bilateral Near:     Physical Exam Vitals and nursing note reviewed.  Constitutional:      General: She is not in acute distress.    Appearance: Normal appearance. She is not ill-appearing or toxic-appearing.  HENT:     Head: Normocephalic and atraumatic.     Right Ear: Tympanic membrane, ear canal and external ear normal.     Left Ear: Tympanic membrane, ear canal and external ear normal.     Nose: No congestion or rhinorrhea.     Mouth/Throat:     Mouth: Mucous membranes are moist.     Pharynx: Oropharynx is clear. No oropharyngeal exudate or posterior oropharyngeal erythema.  Eyes:     General: No scleral icterus.    Extraocular Movements: Extraocular movements intact.  Cardiovascular:     Rate and Rhythm: Normal rate and regular rhythm.  Pulmonary:     Effort: Pulmonary effort is normal. No respiratory distress.     Breath sounds: Normal breath sounds. No wheezing, rhonchi or  rales.  Abdominal:     General: Abdomen is flat.  Musculoskeletal:     Cervical back: Normal range of motion and neck supple.  Lymphadenopathy:     Cervical: No cervical adenopathy.  Skin:    General: Skin is warm and dry.     Coloration: Skin is not jaundiced or pale.     Findings: No erythema or rash.  Neurological:     Mental Status: She is alert and oriented to person, place, and time.     Motor: No weakness.  Psychiatric:        Behavior: Behavior is cooperative.      UC Treatments / Results  Labs (all labs ordered are listed, but only abnormal results are displayed) Labs Reviewed  SARS CORONAVIRUS 2 (TAT 6-24 HRS)    EKG   Radiology No results found.  Procedures Procedures (including critical care time)  Medications Ordered in UC Medications - No data to display  Initial Impression / Assessment and Plan / UC Course  I have reviewed the triage vital signs and the nursing notes.  Pertinent labs & imaging results that were available during my care of the patient were reviewed by me and considered in my medical decision making (see chart for details).   Patient is well-appearing, normotensive, afebrile, not tachycardic, not tachypneic, oxygenating well on room air.    1. Viral URI with cough 2. Encounter for screening for COVID-19 Suspect viral etiology Vitals and exam today are reassuring COVID-19 testing obtained Supportive care discussed with patient, continue Tessalon Perles and start promethazine-DM as needed for dry cough ER and return precautions discussed with patient  The patient was given the opportunity to ask questions.  All questions answered to their satisfaction.  The patient is in agreement to this plan.    Final Clinical Impressions(s) / UC Diagnoses   Final diagnoses:  Viral URI with cough  Encounter for screening for COVID-19     Discharge Instructions      You have a viral upper respiratory infection.  Symptoms should improve  over the next week to 10 days.  If you develop chest pain or shortness of breath, go to the emergency room.  We have tested you today for COVID-19.  You will see the results in Mychart and we will call you with positive results.  Please stay home and isolate until you are aware of the results.    Some things that can make you feel better are: - Increased rest - Increasing fluid with water/sugar free electrolytes - Acetaminophen and ibuprofen as needed for fever/pain - Salt water gargling, chloraseptic spray and throat lozenges - OTC guaifenesin (Mucinex) 600 mg twice daily - Saline sinus flushes or a neti pot - Humidifying the air -Tessalon Perles every 8 hours as needed for dry cough and cough syrup at night time as needed for dry cough     ED Prescriptions     Medication Sig Dispense Auth. Provider   promethazine-dextromethorphan (PROMETHAZINE-DM) 6.25-15 MG/5ML syrup Take 5 mLs by mouth at bedtime as needed for cough. Do not take with alcohol or while driving or operating heavy machinery.  May cause drowsiness. 118 mL Cathlean Marseilles A, NP   benzonatate (TESSALON) 100 MG capsule Take 1 capsule (100 mg total) by mouth 3 (three) times daily as needed for cough. Do not take with alcohol or while driving or operating heavy machinery.  May cause drowsiness. 21 capsule Valentino Nose, NP      PDMP not  reviewed this encounter.   Valentino Nose, NP 08/23/22 [phone]

## 2022-08-23 NOTE — Discharge Instructions (Addendum)
You have a viral upper respiratory infection.  Symptoms should improve over the next week to 10 days.  If you develop chest pain or shortness of breath, go to the emergency room.  We have tested you today for COVID-19.  You will see the results in Mychart and we will call you with positive results.  Please stay home and isolate until you are aware of the results.    Some things that can make you feel better are: - Increased rest - Increasing fluid with water/sugar free electrolytes - Acetaminophen and ibuprofen as needed for fever/pain - Salt water gargling, chloraseptic spray and throat lozenges - OTC guaifenesin (Mucinex) 600 mg twice daily - Saline sinus flushes or a neti pot - Humidifying the air -Tessalon Perles every 8 hours as needed for dry cough and cough syrup at nighttime as needed for dry cough

## 2022-08-24 LAB — SARS CORONAVIRUS 2 (TAT 6-24 HRS): SARS Coronavirus 2: NEGATIVE

## 2022-12-20 ENCOUNTER — Encounter: Payer: Self-pay | Admitting: Gastroenterology

## 2022-12-26 ENCOUNTER — Encounter: Payer: Self-pay | Admitting: Gastroenterology

## 2022-12-26 ENCOUNTER — Ambulatory Visit: Payer: Managed Care, Other (non HMO) | Admitting: Gastroenterology

## 2022-12-26 VITALS — BP 132/92 | Wt 299.0 lb

## 2022-12-26 DIAGNOSIS — Q141 Congenital malformation of retina: Secondary | ICD-10-CM | POA: Diagnosis not present

## 2022-12-26 DIAGNOSIS — Z6841 Body Mass Index (BMI) 40.0 and over, adult: Secondary | ICD-10-CM

## 2022-12-26 NOTE — Patient Instructions (Addendum)
A referral was sent to genetic counseling and they will call you make an referral.  _______________________________________________________  If your blood pressure at your visit was 140/90 or greater, please contact your primary care physician to follow up on this.  _______________________________________________________  If you are age 39 or older, your body mass index should be between 23-30. Your Body mass index is 54.69 kg/m. If this is out of the aforementioned range listed, please consider follow up with your Primary Care Provider.  If you are age 21 or younger, your body mass index should be between 19-25. Your Body mass index is 54.69 kg/m. If this is out of the aformentioned range listed, please consider follow up with your Primary Care Provider.   ________________________________________________________  The [facility] providers would like to encourage you to use [facility] to communicate with providers for non-urgent requests or questions.  Due to long hold times on the telephone, sending your provider a message by [facility] may be a faster and more efficient way to get a response.  Please allow 48 business hours for a response.  Please remember that this is for non-urgent requests.    It was a pleasure to see you today!  Thank you for trusting me with your gastrointestinal care!    Scott E.Tomasa Rand, MD

## 2022-12-26 NOTE — Progress Notes (Signed)
Discussed the use of AI scribe software for clinical note transcription with the patient, who gave verbal consent to proceed.  HPI : Kathy Mason is a 37 y.o. female who is referred to [place] by Rebecka Apley, NP for consideration of early colonoscopy given incidental findings on recent optometry examination.   Patient states that she was advised to get a colonoscopy by her optometrist because on a recent examination she was found to have "bear tracks" which she understands can be associated with colon cancer. It is my understanding that bear tracks is another term for congenital hypertrophy of retinal pigment epithelium (CHRPE) The patient denies any family history of colon cancer, except for possibly an aunt on her mother's side. The patient's mother passed away two years ago during a colonoscopy.  It sounds like she had a PEA arrest.  She does not recall her mother having many polyps.  The mother had kidney failure and was on dialysis, and was also diagnosed with pulmonary hypertension.  The patient expresses concern about potential genetic disorders in her family, given her mother's health history.  Her father died from heart disease.  She reports no symptoms of blood in the stool, unintentional weight loss, or consistent change in bowel habits. She had her gallbladder removed in 2018 and reports that she usually has bowel movements after eating. She denies any stomach aches, cramping, or blood in the stool.   The patient's eye exam was in November, and she received reading glasses three weeks ago but had no vision changes other than blurry vision with reading. She reports high blood pressure during a recent biometric screening at work and plans to follow up with her primary care provider. She also mentions weight gain since having a baby.      Past Medical History:  Diagnosis Date   Depression    PCOS (polycystic ovarian syndrome)    Vaginal Pap smear, abnormal    HPV      Past Surgical History:  Procedure Laterality Date   ANKLE FRACTURE SURGERY     CESAREAN SECTION N/A 06/09/2019   Procedure: CESAREAN SECTION;  Surgeon: Gerald Leitz, MD;  Location: [facility];  Service: Obstetrics;  Laterality: N/A;   CHOLECYSTECTOMY N/A 01/18/2016   Procedure: LAPAROSCOPIC CHOLECYSTECTOMY WITH INTRAOPERATIVE CHOLANGIOGRAM;  Surgeon: Luretha Murphy, MD;  Location: [facility];  Service: General;  Laterality: N/A;   WISDOM TOOTH EXTRACTION     Family History  Problem Relation Age of Onset   Stroke Mother    Hypertension Mother    Diabetes Mother    Kidney disease Mother    Hypertension Father    Diabetes Father    Hypertension Brother    Diabetes Brother    Sarcoidosis Brother    Hypertension Maternal Grandmother    Diabetes Maternal Grandmother    Breast cancer Maternal Grandmother    Hypertension Maternal Grandfather    Social History   Tobacco Use   Smoking status: Never   Smokeless tobacco: Never  Vaping Use   Vaping status: Never Used  Substance Use Topics   Alcohol use: Not Currently    Comment: occasional/ couple times year   Drug use: No   Current Outpatient Medications  Medication Sig Dispense Refill   ferrous sulfate 325 (65 FE) MG tablet Take 1 tablet (325 mg total) by mouth 2 (two) times daily with a meal. 30 tablet 3   benzonatate (TESSALON) 100 MG capsule Take 1 capsule (100 mg total) by mouth 3 (  three) times daily as needed for cough. Do not take with alcohol or while driving or operating heavy machinery.  May cause drowsiness. (Patient not taking: Reported on 12/26/2022) 21 capsule 0   ibuprofen (ADVIL) 800 MG tablet Take 1 tablet (800 mg total) by mouth 3 (three) times daily. (Patient not taking: Reported on 12/26/2022) 21 tablet 0   NIFEdipine (ADALAT CC) 30 MG 24 hr tablet Take 1 tablet (30 mg total) by mouth daily. (Patient not taking: Reported on 12/26/2022) 30 tablet 0   oseltamivir (TAMIFLU) 75 MG capsule Take 1 capsule (75 mg total) by  mouth every 12 (twelve) hours. (Patient not taking: Reported on 12/26/2022) 10 capsule 0   promethazine-dextromethorphan (PROMETHAZINE-DM) 6.25-15 MG/5ML syrup Take 5 mLs by mouth at bedtime as needed for cough. Do not take with alcohol or while driving or operating heavy machinery.  May cause drowsiness. (Patient not taking: Reported on 12/26/2022) 118 mL 0   pseudoephedrine (SUDAFED) 120 MG 12 hr tablet Take 1 tablet (120 mg total) by mouth 2 (two) times daily. (Patient not taking: Reported on 12/26/2022) 30 tablet 0   sertraline (ZOLOFT) 50 MG tablet Take 1 tablet (50 mg total) by mouth daily. (Patient not taking: Reported on 12/26/2022) 30 tablet 0   No current facility-administered medications for this visit.   No Known Allergies   Review of Systems: All systems reviewed and negative except where noted in HPI.    No results found.  Physical Exam: BP (!) 132/92   Wt 299 lb (135.6 kg)   BMI 54.69 kg/m  Constitutional: Pleasant,well-developed, morbidly obese female in no acute distress. HEENT: Normocephalic and atraumatic. Conjunctivae are normal. No scleral icterus. Neck supple.  Cardiovascular: Normal rate, regular rhythm.  Pulmonary/chest: Effort normal and breath sounds normal. No wheezing, rales or rhonchi. Abdominal: Soft, nondistended, nontender. Bowel sounds active throughout. There are no masses palpable. No hepatomegaly. Extremities: no edema Neurological: Alert and oriented to person place and time. Skin: Skin is warm and dry. No rashes noted. Psychiatric: Normal mood and affect. Behavior is normal.  CBC    Component Value Date/Time   WBC 6.0 03/14/2022 1445   RBC 5.55 (H) 03/14/2022 1445   HGB 13.3 03/14/2022 1851   HCT 39.0 03/14/2022 1851   PLT 160 03/14/2022 1445   MCV 71.4 (L) 03/14/2022 1445   MCH 22.2 (L) 03/14/2022 1445   MCHC 31.1 03/14/2022 1445   RDW 15.3 03/14/2022 1445   LYMPHSABS 0.6 (L) 03/14/2022 1445   MONOABS 0.7 03/14/2022 1445   EOSABS  0.1 03/14/2022 1445   BASOSABS 0.0 03/14/2022 1445    CMP     Component Value Date/Time   NA 138 03/14/2022 1851   K 3.8 03/14/2022 1851   CL 104 03/14/2022 1851   CO2 25 03/14/2022 1833   GLUCOSE 100 (H) 03/14/2022 1851   BUN 12 03/14/2022 1851   CREATININE 0.90 03/14/2022 1851   CALCIUM 8.5 (L) 03/14/2022 1833   PROT 5.7 (L) 06/09/2019 0342   ALBUMIN 2.2 (L) 06/09/2019 0342   AST 19 06/09/2019 0342   ALT 17 06/09/2019 0342   ALKPHOS 198 (H) 06/09/2019 0342   BILITOT 0.5 06/09/2019 0342   GFRNONAA >60 03/14/2022 1833   GFRAA >60 06/09/2019 0342       Latest Ref Rng & Units 03/14/2022    6:51 PM 03/14/2022    2:45 PM 06/10/2019    3:50 AM  CBC EXTENDED  WBC 4.0 - 10.5 K/uL  6.0  9.5  RBC 3.87 - 5.11 MIL/uL  5.55  4.54   Hemoglobin 12.0 - 15.0 g/dL 16.1  09.6  04.5   HCT 36.0 - 46.0 % 39.0  39.6  33.9   Platelets 150 - 400 K/uL  160  181   NEUT# 1.7 - 7.7 K/uL  4.5    Lymph# 0.7 - 4.0 K/uL  0.6        ASSESSMENT AND PLAN:  37 year old female with no family history for colon cancer except possible maternal aunt, 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  This finding is associated with Julian Reil syndrome/familial adenomatous polyposis syndrome, but is not pathognomonic from my understanding.  It is my understanding that CHRPE is associated with this genetic disease, and not colon cancer itself.  Although a colonoscopy could be performed, the question would still remain whether she had this mutation.  Given that the finding is associated with a mutation, I think the more logical course of action would be to have the patient undergo genetic testing for the mutation.  If genetic testing is negative, it seems very unlikely that the patient has FAP given the absence of family history.  I am certainly willing to perform a colonoscopy, but her colonoscopy would need to be performed in the hospital setting due to her BMI.  As this would not be a routine screening colonoscopy,  and the hospital setting would increase the cost, this could be a significant amount of money out of pocket for the patient.  Additionally, the patient has concerns about undergoing a colonoscopy given the fact that her mother died during a colonoscopy.   CHRPE - Order genetics referral/genetic testing for Gardner syndrome/FAP   - Will perform colonoscopy if any mutations identified, or if geneticist recommends colonoscopy despite negative testing  Sylver Vantassell E. Tomasa Rand, MD [facility]   [place], Ruby Cola, NP

## 2023-01-04 ENCOUNTER — Telehealth: Payer: Managed Care, Other (non HMO) | Admitting: Family Medicine

## 2023-01-04 DIAGNOSIS — R059 Cough, unspecified: Secondary | ICD-10-CM | POA: Diagnosis not present

## 2023-01-04 DIAGNOSIS — J069 Acute upper respiratory infection, unspecified: Secondary | ICD-10-CM | POA: Diagnosis not present

## 2023-01-04 DIAGNOSIS — R112 Nausea with vomiting, unspecified: Secondary | ICD-10-CM | POA: Diagnosis not present

## 2023-01-04 MED ORDER — PREDNISONE 10 MG (21) PO TBPK
ORAL_TABLET | ORAL | 0 refills | Status: AC
Start: 2023-01-04 — End: ?

## 2023-01-04 MED ORDER — ONDANSETRON HCL 4 MG PO TABS: 4.0000 mg | ORAL_TABLET | Freq: Three times a day (TID) | ORAL | 0 refills | Status: AC | PRN

## 2023-01-04 MED ORDER — FLUTICASONE PROPIONATE 50 MCG/ACT NA SUSP
2.0000 | Freq: Every day | NASAL | 0 refills | Status: AC
Start: 2023-01-04 — End: ?

## 2023-01-04 NOTE — Patient Instructions (Signed)
Kathy Mason, thank you for joining Reed Pandy, PA-C for today's virtual visit.  While this provider is not your primary care provider (PCP), if your PCP is located in our provider database this encounter information will be shared with them immediately following your visit.   A [facility] MyChart account gives you access to today's visit and all your visits, tests, and labs performed at [facility] " click here if you don't have a  MyChart account or go to [email or web address]  Consent: (Patient) Kathy Mason provided verbal consent for this virtual visit at the beginning of the encounter.  Current Medications:  Current Outpatient Medications:    fluticasone (FLONASE) 50 MCG/ACT nasal spray, Place 2 sprays into both nostrils daily., Disp: 16 g, Rfl: 0   ondansetron (ZOFRAN) 4 MG tablet, Take 1 tablet (4 mg total) by mouth every 8 (eight) hours as needed for up to 3 days for nausea or vomiting., Disp: 9 tablet, Rfl: 0   predniSONE (STERAPRED UNI-PAK 21 TAB) 10 MG (21) TBPK tablet, Take following package directions., Disp: 21 tablet, Rfl: 0   benzonatate (TESSALON) 100 MG capsule, Take 1 capsule (100 mg total) by mouth 3 (three) times daily as needed for cough. Do not take with alcohol or while driving or operating heavy machinery.  May cause drowsiness. (Patient not taking: Reported on 12/26/2022), Disp: 21 capsule, Rfl: 0   ferrous sulfate 325 (65 FE) MG tablet, Take 1 tablet (325 mg total) by mouth 2 (two) times daily with a meal., Disp: 30 tablet, Rfl: 3   ibuprofen (ADVIL) 800 MG tablet, Take 1 tablet (800 mg total) by mouth 3 (three) times daily. (Patient not taking: Reported on 12/26/2022), Disp: 21 tablet, Rfl: 0   NIFEdipine (ADALAT CC) 30 MG 24 hr tablet, Take 1 tablet (30 mg total) by mouth daily. (Patient not taking: Reported on 12/26/2022), Disp: 30 tablet, Rfl: 0   oseltamivir (TAMIFLU) 75 MG capsule, Take 1 capsule (75 mg total) by  mouth every 12 (twelve) hours. (Patient not taking: Reported on 12/26/2022), Disp: 10 capsule, Rfl: 0   promethazine-dextromethorphan (PROMETHAZINE-DM) 6.25-15 MG/5ML syrup, Take 5 mLs by mouth at bedtime as needed for cough. Do not take with alcohol or while driving or operating heavy machinery.  May cause drowsiness. (Patient not taking: Reported on 12/26/2022), Disp: 118 mL, Rfl: 0   pseudoephedrine (SUDAFED) 120 MG 12 hr tablet, Take 1 tablet (120 mg total) by mouth 2 (two) times daily. (Patient not taking: Reported on 12/26/2022), Disp: 30 tablet, Rfl: 0   sertraline (ZOLOFT) 50 MG tablet, Take 1 tablet (50 mg total) by mouth daily. (Patient not taking: Reported on 12/26/2022), Disp: 30 tablet, Rfl: 0   Medications ordered in this encounter:  Meds ordered this encounter  Medications   fluticasone (FLONASE) 50 MCG/ACT nasal spray    Sig: Place 2 sprays into both nostrils daily.    Dispense:  16 g    Refill:  0   ondansetron (ZOFRAN) 4 MG tablet    Sig: Take 1 tablet (4 mg total) by mouth every 8 (eight) hours as needed for up to 3 days for nausea or vomiting.    Dispense:  9 tablet    Refill:  0   predniSONE (STERAPRED UNI-PAK 21 TAB) 10 MG (21) TBPK tablet    Sig: Take following package directions.    Dispense:  21 tablet    Refill:  0    Please dispense one standard blister pack taper.     *  If you need refills on other medications prior to your next appointment, please contact your pharmacy*  Follow-Up: Call back or seek an in-person evaluation if the symptoms worsen or if the condition fails to improve as anticipated.   Virtual Care [phone]  Other Instructions Upper Respiratory Infection, Adult An upper respiratory infection (URI) is a common viral infection of the nose, throat, and upper air passages that lead to the lungs. The most common type of URI is the common cold. URIs usually get better on their own, without medical treatment. What are the causes? A  URI is caused by a virus. You may catch a virus by: Breathing in droplets from an infected person's cough or sneeze. Touching something that has been exposed to the virus (is contaminated) and then touching your mouth, nose, or eyes. What increases the risk? You are more likely to get a URI if: You are very young or very old. You have close contact with others, such as at work, school, or a health care facility. You smoke. You have long-term (chronic) heart or lung disease. You have a weakened disease-fighting system (immune system). You have nasal allergies or asthma. You are experiencing a lot of stress. You have poor nutrition. What are the signs or symptoms? A URI usually involves some of the following symptoms: Runny or stuffy (congested) nose. Cough. Sneezing. Sore throat. Headache. Fatigue. Fever. Loss of appetite. Pain in your forehead, behind your eyes, and over your cheekbones (sinus pain). Muscle aches. Redness or irritation of the eyes. Pressure in the ears or face. How is this diagnosed? This condition may be diagnosed based on your medical history and symptoms, and a physical exam. Your health care provider may use a swab to take a mucus sample from your nose (nasal swab). This sample can be tested to determine what virus is causing the illness. How is this treated? URIs usually get better on their own within 7-10 days. Medicines cannot cure URIs, but your health care provider may recommend certain medicines to help relieve symptoms, such as: Over-the-counter cold medicines. Cough suppressants. Coughing is a type of defense against infection that helps to clear the respiratory system, so take these medicines only as recommended by your health care provider. Fever-reducing medicines. Follow these instructions at home: Activity Rest as needed. If you have a fever, stay home from work or school until your fever is gone or until your health care provider says your URI  cannot spread to other people (is no longer contagious). Your health care provider may have you wear a face mask to prevent your infection from spreading. Relieving symptoms Gargle with a mixture of salt and water 3-4 times a day or as needed. To make salt water, completely dissolve -1 tsp (3-6 g) of salt in 1 cup (237 mL) of warm water. Use a cool-mist humidifier to add moisture to the air. This can help you breathe more easily. Eating and drinking  Drink enough fluid to keep your urine pale yellow. Eat soups and other clear broths. General instructions  Take over-the-counter and prescription medicines only as told by your health care provider. These include cold medicines, fever reducers, and cough suppressants. Do not use any products that contain nicotine or tobacco. These products include cigarettes, chewing tobacco, and vaping devices, such as e-cigarettes. If you need help quitting, ask your health care provider. Stay away from secondhand smoke. Stay up to date on all immunizations, including the yearly (annual) flu vaccine. Keep all follow-up  visits. This is important. How to prevent the spread of infection to others URIs can be contagious. To prevent the infection from spreading: Wash your hands with soap and water for at least 20 seconds. If soap and water are not available, use hand sanitizer. Avoid touching your mouth, face, eyes, or nose. Cough or sneeze into a tissue or your sleeve or elbow instead of into your hand or into the air.  Contact a health care provider if: You are getting worse instead of better. You have a fever or chills. Your mucus is brown or red. You have yellow or brown discharge coming from your nose. You have pain in your face, especially when you bend forward. You have swollen neck glands. You have pain while swallowing. You have white areas in the back of your throat. Get help right away if: You have shortness of breath that gets worse. You have  severe or persistent: Headache. Ear pain. Sinus pain. Chest pain. You have chronic lung disease along with any of the following: Making high-pitched whistling sounds when you breathe, most often when you breathe out (wheezing). Prolonged cough (more than 14 days). Coughing up blood. A change in your usual mucus. You have a stiff neck. You have changes in your: Vision. Hearing. Thinking. Mood. These symptoms may be an emergency. Get help right away. Call 911. Do not wait to see if the symptoms will go away. Do not drive yourself to the hospital. Summary An upper respiratory infection (URI) is a common infection of the nose, throat, and upper air passages that lead to the lungs. A URI is caused by a virus. URIs usually get better on their own within 7-10 days. Medicines cannot cure URIs, but your health care provider may recommend certain medicines to help relieve symptoms. This information is not intended to replace advice given to you by your health care provider. Make sure you discuss any questions you have with your health care provider. Document Revised: 07/26/2020 Document Reviewed: 07/26/2020 Elsevier Patient Education  2024 Elsevier Inc.  Nausea and Vomiting, Adult Nausea is feeling that you have an upset stomach and that you are about to vomit. Vomiting is when food in your stomach forcefully comes out of your mouth. Vomiting can make you feel weak. If you vomit, or if you are not able to drink enough fluids, you may not have enough water in your body (get dehydrated). If you do not have enough water in your body, you may: Feel tired. Feel thirsty. Have a dry mouth. Have cracked lips. Pee (urinate) less often. Older adults and people with other diseases or a weak body defense system (immune system) are at higher risk for not having enough water in the body. If you feel like you may vomit or you vomit, it is important to follow instructions from your doctor about how to take  care of yourself. Follow these instructions at home: Watch your symptoms for any changes. Tell your doctor about them. Eating and drinking     Take an ORS (oral rehydration solution). This is a drink that is sold at pharmacies and stores. Drink clear fluids in small amounts as you are able, such as: Water. Ice chips. Fruit juice that has water added (diluted fruit juice). Low-calorie sports drinks. Eat bland, easy-to-digest foods in small amounts as you are able, such as: Bananas. Applesauce. Rice. Low-fat (lean) meats. Toast. Crackers. Avoid drinking fluids that have a lot of sugar or caffeine in them. This includes energy drinks, sports  drinks, and soda. Avoid alcohol. Avoid spicy or fatty foods. General instructions Take over-the-counter and prescription medicines only as told by your doctor. Drink enough fluid to keep your pee (urine) pale yellow. Wash your hands often with soap and water for at least 20 seconds. If you cannot use soap and water, use hand sanitizer. Make sure that everyone in your home washes their hands well and often. Rest at home until you feel better. Watch your condition for any changes. Take slow and deep breaths when you feel like you may vomit. Keep all follow-up visits. Contact a doctor if: Your symptoms get worse. You have new symptoms. You have a fever. You cannot drink fluids without vomiting. You feel like you may vomit for more than 2 days. You feel light-headed or dizzy. You have a headache. You have muscle cramps. You have a rash. You have pain while peeing. Get help right away if: You have pain in your chest, neck, arm, or jaw. You feel very weak or you faint. You vomit again and again. You have vomit that is bright red or looks like black coffee grounds. You have bloody or black poop (stools) or poop that looks like tar. You have a very bad headache, a stiff neck, or both. You have very bad pain, cramping, or bloating in your  belly (abdomen). You have trouble breathing. You are breathing very quickly. Your heart is beating very quickly. Your skin feels cold and clammy. You feel confused. You have signs of losing too much water in your body, such as: Dark pee, very little pee, or no pee. Cracked lips. Dry mouth. Sunken eyes. Sleepiness. Weakness. These symptoms may be an emergency. Get help right away. Call 911. Do not wait to see if the symptoms will go away. Do not drive yourself to the hospital. Summary Nausea is feeling that you have an upset stomach and that you are about to vomit. Vomiting is when food in your stomach comes out of your mouth. Follow instructions from your doctor about eating and drinking. Take over-the-counter and prescription medicines only as told by your doctor. Contact your doctor if your symptoms get worse or you have new symptoms. Keep all follow-up visits. This information is not intended to replace advice given to you by your health care provider. Make sure you discuss any questions you have with your health care provider. Document Revised: 06/30/2020 Document Reviewed: 06/30/2020 Elsevier Patient Education  2024 Elsevier Inc.    If you have been instructed to have an in-person evaluation today at a local Urgent Care facility, please use the link below. It will take you to a list of all of our available [facility], including address, phone number and hours of operation. Please do not delay care.  [facility]  If you or a family member do not have a primary care provider, use the link below to schedule a visit and establish care. When you choose a [facility] primary care physician or advanced practice provider, you gain a long-term partner in health. Find a Primary Care Provider  Learn more about [facility]'s in-office and virtual care options: [facility] - Get Care Now

## 2023-01-04 NOTE — Progress Notes (Signed)
Virtual Visit Consent   Kathy Mason, you are scheduled for a virtual visit with a [facility] provider today. Just as with appointments in the office, your consent must be obtained to participate. Your consent will be active for this visit and any virtual visit you may have with one of our providers in the next 365 days. If you have a MyChart account, a copy of this consent can be sent to you electronically.  As this is a virtual visit, video technology does not allow for your provider to perform a traditional examination. This may limit your provider's ability to fully assess your condition. If your provider identifies any concerns that need to be evaluated in person or the need to arrange testing (such as labs, EKG, etc.), we will make arrangements to do so. Although advances in technology are sophisticated, we cannot ensure that it will always work on either your end or our end. If the connection with a video visit is poor, the visit may have to be switched to a telephone visit. With either a video or telephone visit, we are not always able to ensure that we have a secure connection.  By engaging in this virtual visit, you consent to the provision of healthcare and authorize for your insurance to be billed (if applicable) for the services provided during this visit. Depending on your insurance coverage, you may receive a charge related to this service.  I need to obtain your verbal consent now. Are you willing to proceed with your visit today? Kathy Mason has provided verbal consent on 01/04/2023 for a virtual visit (video or telephone). Reed Pandy, [place]  Date: 01/04/2023 1:15 PM  Virtual Visit via Video Note   I, Reed Pandy, connected with  Kathy Mason  ([id number], Aug 08, 1985) on 01/04/23 at  1:00 PM EST by a video-enabled telemedicine application and verified that I am speaking with the correct person using two identifiers.  Location: Patient: Virtual Visit  Location Patient: Home Provider: Virtual Visit Location Provider: Home Office   I discussed the limitations of evaluation and management by telemedicine and the availability of in person appointments. The patient expressed understanding and agreed to proceed.    History of Present Illness: Kathy Mason is a 37 y.o. who identifies as a female who was assigned female at birth, and is being seen today for states her 39 year old is sick and now she is sick.  She states she has vomiting, headache, fever and pressure in left ear. Pt states states symptoms on going for three days.  Pt states has taken some mucinex and elder berry and her daughters nasal spray.  Pt states she needs nasal spray and something for nausea and vomiting.   HPI: HPI  Problems:  Patient Active Problem List   Diagnosis Date Noted   Gestational diabetes 06/09/2019   Abnormal glucose tolerance test (GTT) during pregnancy, antepartum 04/08/2019   Status post laparoscopic cholecystectomy Jan 2018 01/18/2016    Allergies: No Known Allergies Medications:  Current Outpatient Medications:    fluticasone (FLONASE) 50 MCG/ACT nasal spray, Place 2 sprays into both nostrils daily., Disp: 16 g, Rfl: 0   ondansetron (ZOFRAN) 4 MG tablet, Take 1 tablet (4 mg total) by mouth every 8 (eight) hours as needed for up to 3 days for nausea or vomiting., Disp: 9 tablet, Rfl: 0   predniSONE (STERAPRED UNI-PAK 21 TAB) 10 MG (21) TBPK tablet, Take following package directions., Disp: 21 tablet, Rfl: 0  benzonatate (TESSALON) 100 MG capsule, Take 1 capsule (100 mg total) by mouth 3 (three) times daily as needed for cough. Do not take with alcohol or while driving or operating heavy machinery.  May cause drowsiness. (Patient not taking: Reported on 12/26/2022), Disp: 21 capsule, Rfl: 0   ferrous sulfate 325 (65 FE) MG tablet, Take 1 tablet (325 mg total) by mouth 2 (two) times daily with a meal., Disp: 30 tablet, Rfl: 3   ibuprofen (ADVIL) 800  MG tablet, Take 1 tablet (800 mg total) by mouth 3 (three) times daily. (Patient not taking: Reported on 12/26/2022), Disp: 21 tablet, Rfl: 0   NIFEdipine (ADALAT CC) 30 MG 24 hr tablet, Take 1 tablet (30 mg total) by mouth daily. (Patient not taking: Reported on 12/26/2022), Disp: 30 tablet, Rfl: 0   oseltamivir (TAMIFLU) 75 MG capsule, Take 1 capsule (75 mg total) by mouth every 12 (twelve) hours. (Patient not taking: Reported on 12/26/2022), Disp: 10 capsule, Rfl: 0   promethazine-dextromethorphan (PROMETHAZINE-DM) 6.25-15 MG/5ML syrup, Take 5 mLs by mouth at bedtime as needed for cough. Do not take with alcohol or while driving or operating heavy machinery.  May cause drowsiness. (Patient not taking: Reported on 12/26/2022), Disp: 118 mL, Rfl: 0   pseudoephedrine (SUDAFED) 120 MG 12 hr tablet, Take 1 tablet (120 mg total) by mouth 2 (two) times daily. (Patient not taking: Reported on 12/26/2022), Disp: 30 tablet, Rfl: 0   sertraline (ZOLOFT) 50 MG tablet, Take 1 tablet (50 mg total) by mouth daily. (Patient not taking: Reported on 12/26/2022), Disp: 30 tablet, Rfl: 0  Observations/Objective: Patient is well-developed, well-nourished in no acute distress.  Resting comfortably at home.  Head is normocephalic, atraumatic.  No labored breathing.  Speech is clear and coherent with logical content.  Patient is alert and oriented at baseline.    Assessment and Plan: 1. Upper respiratory tract infection, unspecified type (Primary) - fluticasone (FLONASE) 50 MCG/ACT nasal spray; Place 2 sprays into both nostrils daily.  Dispense: 16 g; Refill: 0  2. Cough in adult - predniSONE (STERAPRED UNI-PAK 21 TAB) 10 MG (21) TBPK tablet; Take following package directions.  Dispense: 21 tablet; Refill: 0  3. Nausea and vomiting, unspecified vomiting type - ondansetron (ZOFRAN) 4 MG tablet; Take 1 tablet (4 mg total) by mouth every 8 (eight) hours as needed for up to 3 days for nausea or vomiting.  Dispense: 9  tablet; Refill: 0  -Pt with possible URI given length of days  -Pt to treat symptomatically -Advised that URIs may take about 10 days to improve -Advised if no improvement to follow up with PCP or urgent care.   Follow Up Instructions: I discussed the assessment and treatment plan with the patient. The patient was provided an opportunity to ask questions and all were answered. The patient agreed with the plan and demonstrated an understanding of the instructions.  A copy of instructions were sent to the patient via MyChart unless otherwise noted below.    The patient was advised to call back or seek an in-person evaluation if the symptoms worsen or if the condition fails to improve as anticipated.    Reed Pandy, PA-C

## 2023-01-14 ENCOUNTER — Encounter: Payer: Self-pay | Admitting: Genetic Counselor

## 2023-01-14 ENCOUNTER — Other Ambulatory Visit: Payer: Self-pay | Admitting: Genetic Counselor

## 2023-01-14 ENCOUNTER — Inpatient Hospital Stay: Payer: Managed Care, Other (non HMO)

## 2023-01-14 ENCOUNTER — Inpatient Hospital Stay: Payer: Managed Care, Other (non HMO) | Attending: Genetic Counselor | Admitting: Genetic Counselor

## 2023-01-14 DIAGNOSIS — Z803 Family history of malignant neoplasm of breast: Secondary | ICD-10-CM | POA: Insufficient documentation

## 2023-01-14 DIAGNOSIS — Z800 Family history of malignant neoplasm of digestive organs: Secondary | ICD-10-CM | POA: Diagnosis not present

## 2023-01-14 DIAGNOSIS — Q141 Congenital malformation of retina: Secondary | ICD-10-CM | POA: Diagnosis not present

## 2023-01-14 LAB — GENETIC SCREENING ORDER

## 2023-01-14 NOTE — Progress Notes (Signed)
 REFERRING PROVIDER: Stacia Glendia BRAVO, MD [address] [place],  [place] [address]  PRIMARY PROVIDER:  Alben Therisa MATSU, PA  PRIMARY REASON FOR VISIT:  1. Family history of breast cancer   2. Family history of pancreatic cancer   3. Family history of colon cancer   4. Congenital hypertrophy of retinal pigment epithelium      HISTORY OF PRESENT ILLNESS:   Kathy Mason, a 38 y.o. female, was seen for a  cancer genetics consultation at the request of Dr. 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  Kathy Mason presents to clinic today to discuss the possibility of a hereditary predisposition to cancer, genetic testing, and to further clarify her future cancer risks, as well as potential cancer risks for family members.    Kathy Mason is a 38 y.o. female with no personal history of cancer.  She was seen by her eye doctor who noted CHRPE and referred her to gastroenterology.  Dr. Stacia saw her and felt that she should first undergo genetic testing to determine if she has a pathogenic mutation in [place].  CANCER HISTORY:  Oncology History   No history exists.     RISK FACTORS:  Menarche was at age 21.  First live birth at age 34.  OCP use for approximately 0 years.  Ovaries intact: yes.  Hysterectomy: no.  Menopausal status: premenopausal.  HRT use: 0 years. Colonoscopy: no; not examined. Mammogram within the last year: no. Number of breast biopsies: 0. Up to date with pelvic exams: n/a. Any excessive radiation exposure in the past: no  Past Medical History:  Diagnosis Date   Congenital hypertrophy of retinal pigment epithelium    Depression    Family history of breast cancer    Family history of colon cancer    Family history of pancreatic cancer    PCOS (polycystic ovarian syndrome)    Vaginal Pap smear, abnormal    HPV    Past Surgical History:  Procedure Laterality  Date   ANKLE FRACTURE SURGERY     CESAREAN SECTION N/A 06/09/2019   Procedure: CESAREAN SECTION;  Surgeon: Rosalva Sawyer, MD;  Location: [facility];  Service: Obstetrics;  Laterality: N/A;   CHOLECYSTECTOMY N/A 01/18/2016   Procedure: LAPAROSCOPIC CHOLECYSTECTOMY WITH INTRAOPERATIVE CHOLANGIOGRAM;  Surgeon: Donnice Lunger, MD;  Location: [facility];  Service: General;  Laterality: N/A;   WISDOM TOOTH EXTRACTION      Social History   Socioeconomic History   Marital status: Single    Spouse name: Not on file   Number of children: Not on file   Years of education: Not on file   Highest education level: Not on file  Occupational History   Not on file  Tobacco Use   Smoking status: Never   Smokeless tobacco: Never  Vaping Use   Vaping status: Never Used  Substance and Sexual Activity   Alcohol use: Not Currently    Comment: occasional/ couple times year   Drug use: No   Sexual activity: Yes    Birth control/protection: Condom    Comment: states her menses are always irregular  Other Topics Concern   Not on file  Social History Narrative   Not on file   Social Drivers of Health   Financial Resource Strain: Not on file  Food Insecurity: Not on file  Transportation Needs: Not on file  Physical Activity: Not on file  Stress: Not on  file  Social Connections: Unknown (11/13/2022)   Received from [facility]   Social Network    Social Network: Not on file     FAMILY HISTORY:  We obtained a detailed, 4-generation family history.  Significant diagnoses are listed below: Family History  Problem Relation Age of Onset   Stroke Mother    Hypertension Mother    Diabetes Mother    Kidney disease Mother    Hypertension Father    Diabetes Father    Hypertension Brother    Diabetes Brother    Sarcoidosis Brother    Colon cancer Maternal Aunt    Pancreatic cancer Paternal Uncle    Hypertension Maternal Grandmother    Diabetes Maternal Grandmother    Breast cancer Maternal Grandmother         dx. 58s   Hypertension Maternal Grandfather    Breast cancer Cousin        mat first cousin;     The patient has one daughter who is cancer free.  She has one full brother and a paternal half sister who are cancer free.  Both parents are deceased.  The patient's father died of a heart attack.  She reports that he had a ileostomy bag due to a colon surgery and he developed a hernia due to the surgery.  He had a maternal half brother and sister and a paternal half brother.  His maternal brother died of pancreatic cancer.  The patient's mother died from complications after a colonoscopy.  She had two polyps.  She had four sisters and two brothers.  One sister had either colon cancer or stomach cancer, and another sister had daughter with breast cancer.  The maternal grandmother had breast cancer.  Kathy Mason is unaware of previous family history of genetic testing for hereditary cancer risks.  There is no reported Ashkenazi Jewish ancestry. There is no known consanguinity.  GENETIC COUNSELING ASSESSMENT: Kathy Mason is a 38 y.o. female with a family history of cancer and a personal history of CHRPE which is somewhat suggestive of a hereditary cancer syndrome such as FAP. We, therefore, discussed and recommended the following at today's visit.   DISCUSSION: We discussed that, in general, most cancer is not inherited in families, but instead is sporadic or familial. Sporadic cancers occur by chance and typically happen at older ages (>50 years) as this type of cancer is caused by genetic changes acquired during an individual's lifetime. Some families have more cancers than would be expected by chance; however, the ages or types of cancer are not consistent with a known genetic mutation or known genetic mutations have been ruled out. This type of familial cancer is thought to be due to a combination of multiple genetic, environmental, hormonal, and lifestyle factors. While this combination of  factors likely increases the risk of cancer, the exact source of this risk is not currently identifiable or testable.  We discussed that 5 - 10% of cancer is due to hereditary mutations.  Approximately 60% of CHRPE cases are associated with Familial Adenomatous Polyposis, or FAP.  We discussed that FAP is hereditary colon cancer syndrome associated with a greatly increased risk for colon cancer due to 100's-1000's of colon polyps.  Many times, the colon cancer can occur at young ages. Most of the time, hereditary conditions are passed from parent to child, other times hereditary conditions are de novo (start with an individual) and are passed on from there.  While most cases of FAP are inherited, about  20-25% of cases are de novo.  We discussed that testing is beneficial for several reasons including knowing how to follow individuals who test positive, possibly trying to prevent a cancer or identify it early when it is more treatable as well as understand if other family members could be at risk for cancer and allow them to undergo genetic testing.   We reviewed the characteristics, features and inheritance patterns of hereditary cancer syndromes. We also discussed genetic testing, including the appropriate family members to test, the process of testing, insurance coverage and turn-around-time for results. We discussed the implications of a negative, positive, carrier and/or variant of uncertain significant result. Ms. Kubicek  was offered a common hereditary cancer panel (36+ genes) and an expanded pan-cancer panel (70+ genes). Ms. Mesmer was informed of the benefits and limitations of each panel, including that expanded pan-cancer panels contain genes that do not have clear management guidelines at this point in time.  We also discussed that as the number of genes included on a panel increases, the chances of variants of uncertain significance increases. Ms. Fullenwider decided to pursue genetic testing for the  CancerNext+RNA gene panel.   The [facility] CancerNext+RNAinsight Panel includes sequencing, rearrangement analysis, and RNA analysis for the following 39 genes: APC, ATM, BAP1, BARD1, BMPR1A, BRCA1, BRCA2, BRIP1, CDH1, CDKN2A, CHEK2, FH, FLCN, MET, MLH1, MSH2, MSH6, MUTYH, NF1, NTHL1, PALB2, PMS2, PTEN, RAD51C, RAD51D, SMAD4, STK11, TP53, TSC1, TSC2, and VHL (sequencing and deletion/duplication); AXIN2, HOXB13, MBD4, MSH3, POLD1 and POLE (sequencing only); EPCAM and GREM1 (deletion/duplication only).   Based on Ms. Castilleja's personal history of CHRPE and family history of cancer, she meets NCCN 3.2024 Adenomatous Polyposis Testing Criteria for genetic testing. Despite that she meets criteria, she may still have an out of pocket cost. We discussed that if her out of pocket cost for testing is over $100, the laboratory will call and confirm whether she wants to proceed with testing.  If the out of pocket cost of testing is less than $100 she will be billed by the genetic testing laboratory.   We discussed that some people do not want to undergo genetic testing due to fear of genetic discrimination.  The Genetic Information Nondiscrimination Act (GINA) was signed into federal law in 2008. GINA prohibits health insurers and most employers from discriminating against individuals based on genetic information (including the results of genetic tests and family history information). According to GINA, health insurance companies cannot consider genetic information to be a preexisting condition, nor can they use it to make decisions regarding coverage or rates. GINA also makes it illegal for most employers to use genetic information in making decisions about hiring, firing, promotion, or terms of employment. It is important to note that GINA does not offer protections for life insurance, disability insurance, or long-term care insurance. GINA does not apply to those in the [employer], those who work for companies with less  than 15 employees, and new life insurance or long-term disability insurance policies.  Health status due to a cancer diagnosis is not protected under GINA. More information about GINA can be found by visiting eliteclients.be.  PLAN: After considering the risks, benefits, and limitations, Ms. Coplen provided informed consent to pursue genetic testing and the blood sample was sent to [facility] for analysis of the CancerNext+RNAinsight. Results should be available within approximately 2-3 weeks' time, at which point they will be disclosed by telephone to Ms. Pettinato, as will any additional recommendations warranted by these results. Ms. Shinall  will receive a summary of her genetic counseling visit and a copy of her results once available. This information will also be available in Epic.   Lastly, we encouraged Ms. Swaby to remain in contact with cancer genetics annually so that we can continuously update the family history and inform her of any changes in cancer genetics and testing that may be of benefit for this family.   Ms. Brandel questions were answered to her satisfaction today. Our contact information was provided should additional questions or concerns arise. Thank you for the referral and allowing us  to share in the care of your patient.   Wren Gallaga P. Perri, MS, CGC Licensed, Patent Attorney [email or web address] phone: [phone]  90 minutes were spent on the date of the encounter in service to the patient including preparation, face-to-face consultation, documentation and care coordination.  The patient was seen alone.  Drs. Lanny Stalls, and/or Gudena were available for questions, if needed..    _______________________________________________________________________ For Office Staff:  Number of people involved in session: 1 Was an Intern/ student involved with case: no

## 2023-01-29 ENCOUNTER — Encounter: Payer: Self-pay | Admitting: Genetic Counselor

## 2023-01-29 DIAGNOSIS — Z1379 Encounter for other screening for genetic and chromosomal anomalies: Secondary | ICD-10-CM | POA: Insufficient documentation

## 2023-02-03 ENCOUNTER — Telehealth: Payer: Self-pay | Admitting: Genetic Counselor

## 2023-02-03 NOTE — Telephone Encounter (Signed)
LM on VM that results are back and to please call.  Left CB instructions.

## 2023-02-06 NOTE — Telephone Encounter (Signed)
LM on VM that results are back and to please call.  Left CB instructions.

## 2023-02-10 ENCOUNTER — Ambulatory Visit: Payer: Self-pay | Admitting: Genetic Counselor

## 2023-02-10 ENCOUNTER — Encounter: Payer: Self-pay | Admitting: Genetic Counselor

## 2023-02-10 DIAGNOSIS — Z1379 Encounter for other screening for genetic and chromosomal anomalies: Secondary | ICD-10-CM

## 2023-02-10 NOTE — Progress Notes (Addendum)
HPI:  Ms. Sumners was previously seen in the [facility] due to a personal history of CHRPE, a family history of cancer and concerns regarding a hereditary predisposition to cancer. Please refer to our prior cancer genetics clinic note for more information regarding our discussion, assessment and recommendations, at the time. Ms. Presutti recent genetic test results were disclosed to her, as were recommendations warranted by these results. These results and recommendations are discussed in more detail below.  CANCER HISTORY:  Oncology History   No history exists.    FAMILY HISTORY:  We obtained a detailed, 4-generation family history.  Significant diagnoses are listed below: Family History  Problem Relation Age of Onset   Stroke Mother    Hypertension Mother    Diabetes Mother    Kidney disease Mother    Hypertension Father    Diabetes Father    Hypertension Brother    Diabetes Brother    Sarcoidosis Brother    Colon cancer Maternal Aunt    Pancreatic cancer Paternal Uncle    Hypertension Maternal Grandmother    Diabetes Maternal Grandmother    Breast cancer Maternal Grandmother        dx. 15s   Hypertension Maternal Grandfather    Breast cancer Cousin        mat first cousin;       The patient has one daughter who is cancer free.  She has one full brother and a paternal half sister who are cancer free.  Both parents are deceased.   The patient's father died of a heart attack.  She reports that he had a ileostomy bag due to a colon surgery and he developed a hernia due to the surgery.  He had a maternal half brother and sister and a paternal half brother.  His maternal brother died of pancreatic cancer.   The patient's mother died from complications after a colonoscopy.  She had two polyps.  She had four sisters and two brothers.  One sister had either colon cancer or stomach cancer, and another sister had daughter with breast cancer.  The maternal  grandmother had breast cancer.   Ms. Hakanson is unaware of previous family history of genetic testing for hereditary cancer risks.  There is no reported Ashkenazi Jewish ancestry. There is no known consanguinity.  GENETIC TEST RESULTS: Genetic testing reported out on January 28, 2023 through the CancerNext+RNAinsight cancer panel found no pathogenic mutations. The [facility] CancerNext+RNAinsight Panel includes sequencing, rearrangement analysis, and RNA analysis for the following 39 genes: APC, ATM, BAP1, BARD1, BMPR1A, BRCA1, BRCA2, BRIP1, CDH1, CDKN2A, CHEK2, FH, FLCN, MET, MLH1, MSH2, MSH6, MUTYH, NF1, NTHL1, PALB2, PMS2, PTEN, RAD51C, RAD51D, SMAD4, STK11, TP53, TSC1, TSC2, and VHL (sequencing and deletion/duplication); AXIN2, HOXB13, MBD4, MSH3, POLD1 and POLE (sequencing only); EPCAM and GREM1 (deletion/duplication only). The test report has been scanned into EPIC and is located under the Molecular Pathology section of the Results Review tab.  A portion of the result report is included below for reference.       We discussed with Ms. Mckinney that because current genetic testing is not perfect, it is possible there may be a gene mutation in one of these genes that current testing cannot detect, but that chance is small.  We also discussed, that there could be another gene that has not yet been discovered, or that we have not yet tested, that is responsible for the cancer diagnoses in the family. It is also possible there is a  hereditary cause for the cancer in the family that Ms. Amir did not inherit and therefore was not identified in her testing.  Therefore, it is important to remain in touch with cancer genetics in the future so that we can continue to offer Ms. Kunz the most up to date genetic testing.   Genetic testing did identify a variant of uncertain significance (VUS) was identified in the BRCA2 gene called p.T665A ([id number]).  At this time, it is unknown if this variant is associated  with increased cancer risk or if this is a normal finding, but most variants such as this get reclassified to being inconsequential. It should not be used to make medical management decisions. With time, we suspect the lab will determine the significance of this variant, if any. If we do learn more about it, we will try to contact Ms. Flitton to discuss it further. However, it is important to stay in touch with [place] periodically and keep the address and phone number up to date.  ADDITIONAL GENETIC TESTING: We discussed with Ms. Ueda that there are other genes that are associated with increased cancer risk that can be analyzed. Should Ms. Upperman wish to pursue additional genetic testing, we are happy to discuss and coordinate this testing, at any time.    CANCER SCREENING RECOMMENDATIONS: Ms. Hentz test result is considered negative (normal).  This means that we have not identified a hereditary cause for her personal history of CHRPE or family history of cancer at this time. Most cases of isolated CHRPE and as well as cancers happen by chance and this negative test suggests that her personal history of CHRPE may fall into this category.    Possible reasons for Ms. Longbottom's negative genetic test include:  1. There may be a gene mutation in one of these genes that current testing methods cannot detect but that chance is small.  2. There could be another gene that has not yet been discovered, or that we have not yet tested, that is responsible for the cancer diagnoses in the family.  3.  There may be no hereditary risk for cancer in the family. The cancers in Ms. Dieterich and/or her family may be sporadic/familial or due to other genetic and environmental factors. 4. It is also possible there is a hereditary cause for the cancer in the family that Ms. Petrovich did not inherit.  Therefore, it is recommended she continue to follow the cancer management and screening guidelines provided by her primary  healthcare provider. An individual's cancer risk and medical management are not determined by genetic test results alone. Overall cancer risk assessment incorporates additional factors, including personal medical history, family history, and any available genetic information that may result in a personalized plan for cancer prevention and surveillance  RECOMMENDATIONS FOR FAMILY MEMBERS:   Since she did not inherit a identifiable mutation in a cancer predisposition gene included on this panel, her children could not have inherited a known mutation from her in one of these genes. Individuals in this family might be at some increased risk of developing cancer, over the general population risk, simply due to the family history of cancer.  We recommended women in this family have a yearly mammogram beginning at age 78, or 92 years younger than the earliest onset of cancer, an annual clinical breast exam, and perform monthly breast self-exams. Women in this family should also have a gynecological exam as recommended by their primary provider. All family members should be referred for colonoscopy  starting at age 64, or 42 years younger than the earliest onset of cancer.  FOLLOW-UP: Lastly, we discussed with Ms. Standard that cancer genetics is a rapidly advancing field and it is possible that new genetic tests will be appropriate for her and/or her family members in the future. We encouraged her to remain in contact with cancer genetics on an annual basis so we can update her personal and family histories and let her know of advances in cancer genetics that may benefit this family.   Our contact number was provided. Ms. Kosloski questions were answered to her satisfaction, and she knows she is welcome to call us at anytime with additional questions or concerns.   Maylon Cos, MS, [facility] Licensed, Certified Genetic Counselor [email or web address]

## 2023-02-14 ENCOUNTER — Telehealth: Payer: Self-pay | Admitting: Genetic Counselor

## 2023-02-14 NOTE — Telephone Encounter (Signed)
 Left final message that I have been unable to reach her by phone and I noticed that she did not view her Mychart message.  I left CB instructions on how she may be able to reach me and that we had good news for her on her results.  I did not leave further details as the VM did not confirm the person who the message would be left for.
